# Patient Record
Sex: Female | Born: 1985 | Race: White | Hispanic: No | Marital: Single | Smoking: Never smoker
Health system: Southern US, Community
[De-identification: ages and names within clinical notes are randomized; demographics above are authoritative.]

## PROBLEM LIST (undated history)

## (undated) DIAGNOSIS — D6851 Activated protein C resistance: Secondary | ICD-10-CM

## (undated) DIAGNOSIS — N200 Calculus of kidney: Secondary | ICD-10-CM

## (undated) DIAGNOSIS — I82409 Acute embolism and thrombosis of unspecified deep veins of unspecified lower extremity: Secondary | ICD-10-CM

## (undated) HISTORY — DX: Acute embolism and thrombosis of unspecified deep veins of unspecified lower extremity: I82.409

## (undated) HISTORY — DX: Activated protein C resistance: D68.51

## (undated) HISTORY — DX: Calculus of kidney: N20.0

## (undated) HISTORY — PX: LITHOTRIPSY: SUR834

---
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
## 2006-01-11 ENCOUNTER — Ambulatory Visit: Payer: Self-pay | Admitting: Urology

## 2006-06-20 ENCOUNTER — Ambulatory Visit: Payer: Self-pay | Admitting: Urology

## 2006-11-26 ENCOUNTER — Ambulatory Visit: Payer: Self-pay | Admitting: Urology

## 2007-06-19 ENCOUNTER — Ambulatory Visit: Payer: Self-pay | Admitting: Urology

## 2007-06-27 ENCOUNTER — Inpatient Hospital Stay: Payer: Self-pay | Admitting: Specialist

## 2007-06-27 ENCOUNTER — Ambulatory Visit: Payer: Self-pay | Admitting: Family Medicine

## 2007-07-24 ENCOUNTER — Ambulatory Visit: Payer: Self-pay | Admitting: Internal Medicine

## 2007-08-24 ENCOUNTER — Ambulatory Visit: Payer: Self-pay | Admitting: Internal Medicine

## 2007-11-27 IMAGING — CR DG ABDOMEN 1V
1 series · 1 of 1 positions shown · non-contrast
Comparison: none

REASON FOR EXAM: Nephrolithiasis
COMMENTS:

[view not recorded]
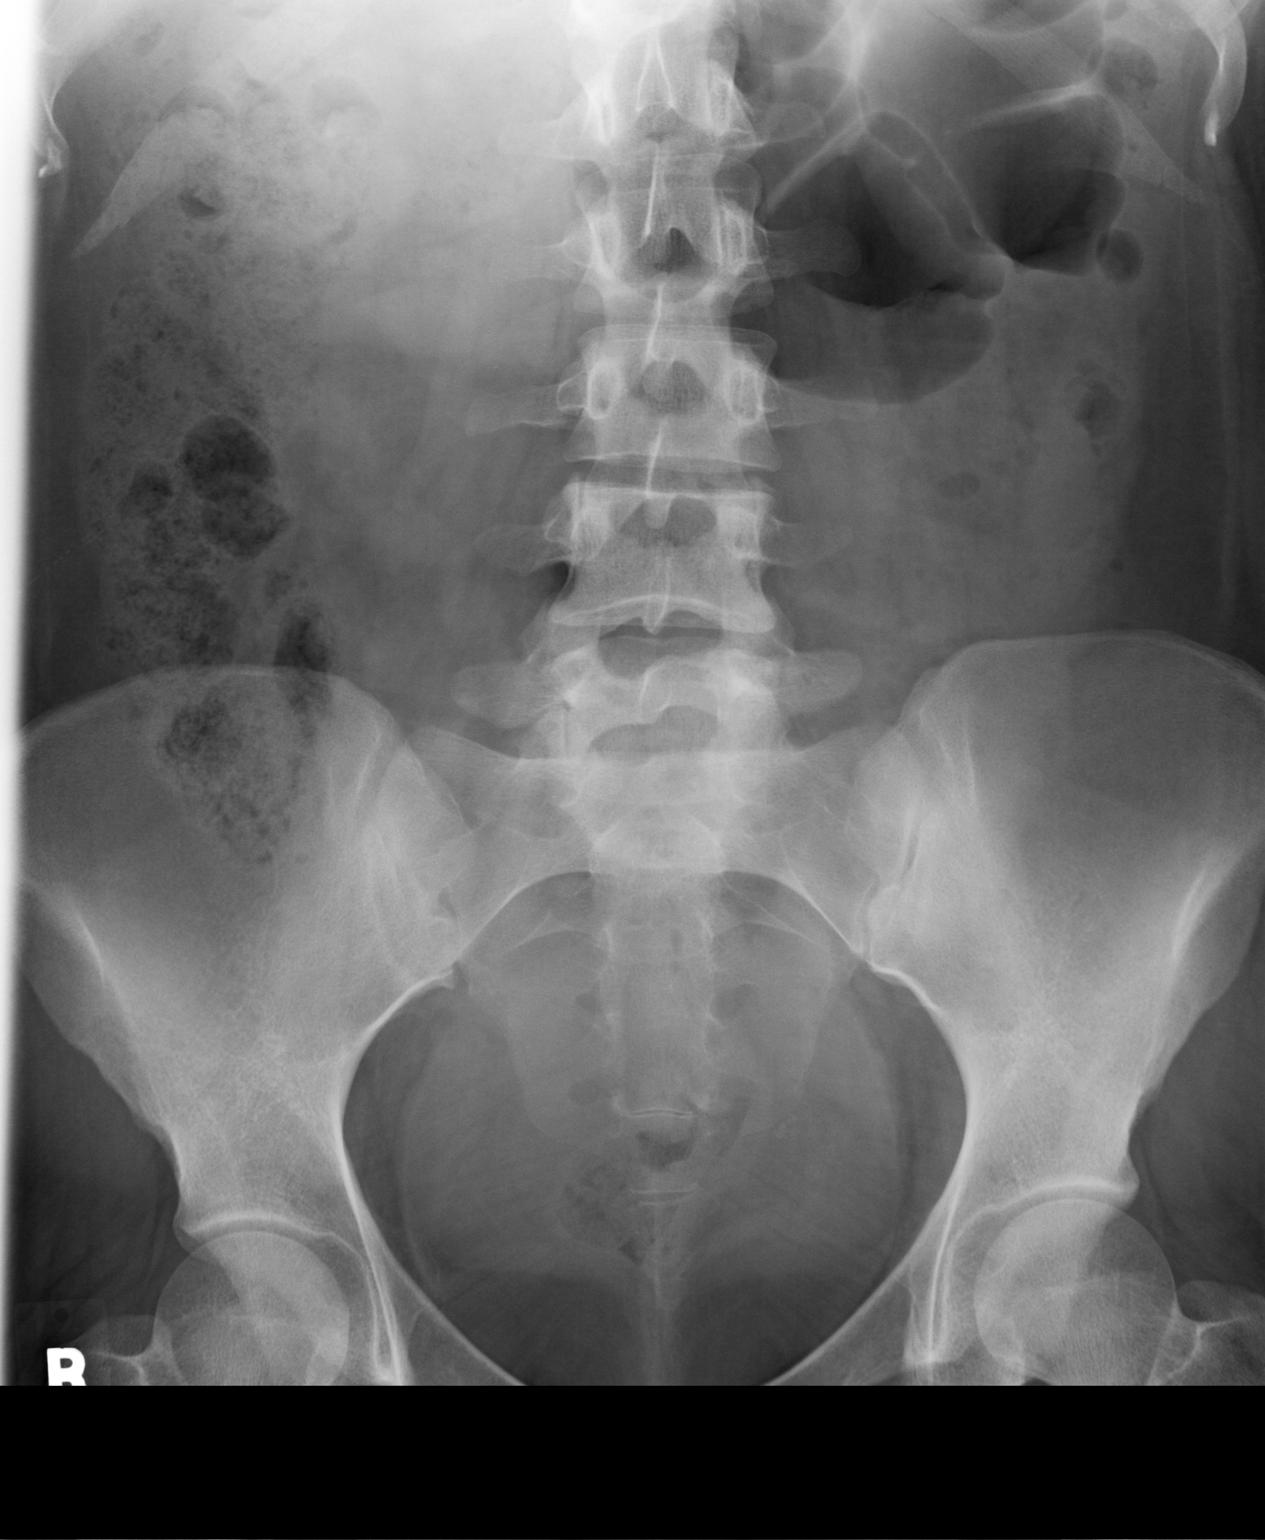

[1 of 1 positions shown; findings below may reference images not displayed]

PROCEDURE:     DXR - DXR KIDNEY URETER BLADDER  - November 26, 2006  [DATE]

RESULT:     Frontal view of lower abdomen and pelvis is performed.

The previously described, faint calcification in the lower pole region of
the RIGHT kidney is not clearly identified on this study. This may be
obscured by a large amount of stool which projects in the region of the
ascending colon. Air is seen within nondilated loops of bowel. No further
calcified densities are identified. The urinary bladder is distended with
urine and the visualized bony skeleton is unremarkable.
IMPRESSION: The previously described calcified density in the region of
the lower pole of the RIGHT renal fossa is not clearly identified.

## 2008-01-13 ENCOUNTER — Ambulatory Visit: Payer: Self-pay | Admitting: Urology

## 2008-08-31 ENCOUNTER — Ambulatory Visit: Payer: Self-pay | Admitting: Urology

## 2009-06-23 ENCOUNTER — Ambulatory Visit: Payer: Self-pay | Admitting: Internal Medicine

## 2009-08-23 ENCOUNTER — Ambulatory Visit: Payer: Self-pay | Admitting: Urology

## 2018-01-23 DIAGNOSIS — A749 Chlamydial infection, unspecified: Secondary | ICD-10-CM

## 2018-01-23 DIAGNOSIS — R87612 Low grade squamous intraepithelial lesion on cytologic smear of cervix (LGSIL): Secondary | ICD-10-CM

## 2018-01-23 HISTORY — DX: Chlamydial infection, unspecified: A74.9

## 2018-01-23 HISTORY — DX: Low grade squamous intraepithelial lesion on cytologic smear of cervix (LGSIL): R87.612

## 2018-07-03 DIAGNOSIS — F341 Dysthymic disorder: Secondary | ICD-10-CM | POA: Diagnosis not present

## 2018-07-10 DIAGNOSIS — F341 Dysthymic disorder: Secondary | ICD-10-CM | POA: Diagnosis not present

## 2018-07-17 DIAGNOSIS — F341 Dysthymic disorder: Secondary | ICD-10-CM | POA: Diagnosis not present

## 2018-07-24 DIAGNOSIS — F341 Dysthymic disorder: Secondary | ICD-10-CM | POA: Diagnosis not present

## 2018-07-31 DIAGNOSIS — F341 Dysthymic disorder: Secondary | ICD-10-CM | POA: Diagnosis not present

## 2018-08-06 DIAGNOSIS — R030 Elevated blood-pressure reading, without diagnosis of hypertension: Secondary | ICD-10-CM | POA: Diagnosis not present

## 2018-08-06 DIAGNOSIS — K219 Gastro-esophageal reflux disease without esophagitis: Secondary | ICD-10-CM | POA: Diagnosis not present

## 2018-08-07 DIAGNOSIS — F341 Dysthymic disorder: Secondary | ICD-10-CM | POA: Diagnosis not present

## 2018-08-11 ENCOUNTER — Other Ambulatory Visit: Payer: Self-pay

## 2018-08-11 ENCOUNTER — Ambulatory Visit
Admission: EM | Admit: 2018-08-11 | Discharge: 2018-08-11 | Disposition: A | Discharge status: Home / Self Care | Payer: BC Managed Care – PPO

## 2018-08-11 DIAGNOSIS — L0501 Pilonidal cyst with abscess: Secondary | ICD-10-CM | POA: Diagnosis not present

## 2018-08-11 MED ORDER — LIDOCAINE 5 % EX OINT: 1.0000 "application " | TOPICAL_OINTMENT | Freq: Every day | CUTANEOUS | 0 refills | Status: DC | PRN

## 2018-08-11 MED ORDER — CLINDAMYCIN HCL 150 MG PO CAPS: 150.0000 mg | ORAL_CAPSULE | Freq: Four times a day (QID) | ORAL | 0 refills | Status: DC

## 2018-08-11 MED ORDER — LIDOCAINE HCL (PF) 1 % IJ SOLN
10.0000 mL | Freq: Once | INTRAMUSCULAR | Status: AC
  Administered 2018-08-11: 10 mL

## 2018-08-11 NOTE — Discharge Instructions (Signed)
It was very nice seeing you today in clinic. Thank you for entrusting me with your care.   Please utilize the medications that we discussed. Your prescriptions have been called in to your pharmacy. Use warm compresses to help promote drainage.  Make arrangements to follow up with your regular doctor in 1 week for re-evaluation if not improving. If your symptoms/condition worsens, please seek follow up care either here or in the ER. Please remember, our [place] providers are "right here with you" when you need [place].   Again, it was my pleasure to take care of you today. Thank you for choosing our clinic. I hope that you start to feel better quickly.   Honor Loh, MSN, APRN, FNP-C, CEN Advanced Practice Provider [facility]

## 2018-08-11 NOTE — ED Triage Notes (Signed)
Patient complains of abscess at top of her buttocks. Patient states that are has been progressively worsening over the last few days. Patient reports that her father has pilonidal cysts and she is concerned this may be what she has as well.

## 2018-08-11 NOTE — ED Provider Notes (Signed)
[place], [place]   Name: Caitlin Cochran DOB: [birth year] MRN: [id number] CSN: [id number] PCP: [facility] date and time:  08/11/18 1457  Chief Complaint:  Abscess   NOTE: Prior to seeing the patient today, I have reviewed the triage nursing documentation and vital signs. Clinical staff has updated patient's PMH/PSHx, current medication list, and drug allergies/intolerances to ensure comprehensive history available to assist in medical decision making.   History:   HPI: Caitlin Cochran is a 33 y.o. female who presents today with complaints of an abscess to her RIGHT buttock. Area has been increasingly painful over the course of the last 2 days. Patient denies any associated fevers. She has not experienced any drainage from the area. Patient intermittently nauseated secondary to the pain. She notes that she has been fatigued over the last few days citing an inability to sleep due to pain as the cause.  Despite her symptoms, patient has not taken any over the counter interventions to help improve/relieve her reported symptoms at home.   Patient has questionable PMH for the same, however she notes that "there are no records of it". Patient has no scarring to the area to suggest previous I&D procedures. Patient advises that her father has a (+) history of recurrent pilonidal cysts.   History reviewed. No pertinent past medical history.  Past Surgical History:  Procedure Laterality Date  . LITHOTRIPSY      Family History  Problem Relation Age of Onset  . Healthy Mother   . Healthy Father     Social History   Tobacco Use  . Smoking status: Never Smoker  . Smokeless tobacco: Never Used  Substance Use Topics  . Alcohol use: Yes    Comment: occasionally  . Drug use: Never    There are no active problems to display for this patient.   Home Medications:    Current Meds  Medication Sig  . cimetidine (TAGAMET) 800 MG tablet Take 800 mg by mouth at  bedtime.    Allergies:   Bee venom and Doxycycline  Review of Systems (ROS): Review of Systems  Constitutional: Positive for fatigue. Negative for chills and fever.  Respiratory: Negative for cough and shortness of breath.   Cardiovascular: Negative for chest pain and palpitations.  Gastrointestinal: Positive for nausea (intermittent). Negative for vomiting.  Skin: Positive for color change.  Neurological: Negative for dizziness, syncope, weakness and headaches.  All other systems reviewed and are negative.    Vital Signs: Today's Vitals   08/11/18 1507 08/11/18 1509 08/11/18 1557  BP:  (!) 118/95   Pulse:  100   Resp:  18   Temp:  98.5 F (36.9 C)   TempSrc:  Oral   SpO2:  100%   Weight: 258 lb (117 kg)    Height: 5\' 7"  (1.702 m)    PainSc: 10-Worst pain ever  10-Worst pain ever    Physical Exam: Physical Exam  Constitutional: She is oriented to person, place, and time and well-developed, well-nourished, and in no distress.  HENT:  Head: Normocephalic and atraumatic.  Mouth/Throat: Mucous membranes are normal.  Eyes: Pupils are equal, round, and reactive to light. EOM are normal.  Cardiovascular: Normal rate, regular rhythm, normal heart sounds and intact distal pulses. Exam reveals no gallop and no friction rub.  No murmur heard. Pulmonary/Chest: Effort normal and breath sounds normal. No respiratory distress. She has no wheezes. She has no rales.  Neurological: She is alert and oriented to person, place,  and time. Gait normal. GCS score is 15.  Skin: Skin is warm and dry.     Psychiatric: Mood, memory, affect and judgment normal.  Nursing note and vitals reviewed.   Urgent Care Treatments / Results:   LABS: PLEASE NOTE: all labs that were ordered this encounter are listed, however only abnormal results are displayed. Labs Reviewed - No data to display  EKG: -None  RADIOLOGY: No results found.  PROCEDURES: Incision and Drainage  Date/Time: 08/11/2018  3:45 PM Performed by: Verlee MonteGray, Jabarri Stefanelli E, NP Authorized by: Verlee MonteGray, Shandricka Monroy E, NP   Consent:    Consent obtained:  Verbal   Consent given by:  Patient   Risks discussed:  Bleeding, incomplete drainage, pain, damage to other organs and infection   Alternatives discussed:  No treatment, alternative treatment and referral Universal protocol:    Patient identity confirmed:  Verbally with patient Location:    Type:  Pilonidal cyst   Location: RIGHT buttock; adjacent to superior aspect of natal cleft. Pre-procedure details:    Skin preparation:  Betadine Anesthesia (see MAR for exact dosages):    Anesthesia method:  Local infiltration   Local anesthetic:  Lidocaine 1% w/o epi Procedure type:    Complexity:  Simple Procedure details:    Needle aspiration: yes     Needle size:  18 G (minimal return)   Incision types:  Single straight   Incision depth:  Dermal   Scalpel blade:  11   Wound management:  Extensive cleaning   Drainage:  Purulent and bloody   Drainage amount:  Scant   Wound treatment:  Wound left open   Packing materials:  None Post-procedure details:    Patient tolerance of procedure:  Tolerated well, no immediate complications Comments:     Discussed minimal return. Cook, DO visualized area following I&D; felt that firmness was likely to be secondary to induration/inflammation.    MEDICATIONS RECEIVED THIS VISIT: Medications  lidocaine (PF) (XYLOCAINE) 1 % injection 10 mL (10 mLs Infiltration Given by Other 08/11/18 1558)    PERTINENT CLINICAL COURSE NOTES/UPDATES:   Initial Impression / Assessment and Plan / Urgent Care Course:  Pertinent labs & imaging results that were available during my care of the patient were personally reviewed by me and considered in my medical decision making (see lab/imaging section of note for values and interpretations).  Caitlin Cochran is a 33 y.o. female [facility] with [facility] of Abscess   Patient  is well appearing overall in clinic today. She does not appear to be in any acute distress. Presenting symptoms (see HPI) and exam as documented above. I&D performed as per above yielding minimal return. Cook, DO visualized area following I&D; discussed. Firmness either related to induration/inflammation, or abscess deeper than can safely be drained in the urgent care setting. DO recommended no further attempts to drain as early abscess with significant inflammation/induration felt to be more likely. Will cover patient with a 7 day course of clindamycin. Recommended warm compresses and/or baths to help reduce inflammation and promote continued drainage. May use Tylenol and/or Ibuprofen as needed for discomfort. Will provide lidocaine ointment in efforts to achieve topical analgesia when patient experiences severe discomfort.  Discussed follow up with primary care physician in 1 week for re-evaluation. If she has not improved with the I&D, oral antibiotics, and conservative treatment, she would benefit from a referral to surgery for further evaluation and management.  I have reviewed the follow up and strict return  precautions for any new or worsening symptoms. Patient is aware of symptoms that would be deemed urgent/emergent, and would thus require further evaluation either here or in the emergency department. At the time of discharge, she verbalized understanding and consent with the discharge plan as it was reviewed with her. All questions were fielded by provider and/or clinic staff prior to patient discharge.    Final Clinical Impressions / Urgent Care Diagnoses:   Final diagnoses:  Pilonidal cyst with abscess    New Prescriptions:  [place] Controlled Substance Registry consulted? Not Applicable  Meds ordered this encounter  Medications  . lidocaine (PF) (XYLOCAINE) 1 % injection 10 mL  . clindamycin (CLEOCIN) 150 MG capsule    Sig: Take 1 capsule (150 mg total) by mouth every 6 (six) hours.     Dispense:  28 capsule    Refill:  0  . lidocaine (XYLOCAINE) 5 % ointment    Sig: Apply 1 application topically daily as needed.    Dispense:  35.44 g    Refill:  0    Recommended Follow up Care:  Patient encouraged to follow up with the following provider within the specified time frame, or sooner as dictated by the severity of her symptoms. As always, she was instructed that for any urgent/emergent care needs, she should seek care either here or in the emergency department for more immediate evaluation.  Follow-up Information    [facility] In 1 week.   Why: General reassessment of symptoms if not improving Contact information: [address] [place] [address] [phone]         NOTE: This note was prepared using Dragon dictation software along with smaller phrase technology. Despite my best ability to proofread, there is the potential that transcriptional errors may still occur from this process, and are completely unintentional.    Verlee MonteGray, Clevester Helzer E, NP 08/11/18 2124

## 2018-08-21 DIAGNOSIS — F341 Dysthymic disorder: Secondary | ICD-10-CM | POA: Diagnosis not present

## 2018-08-28 DIAGNOSIS — F341 Dysthymic disorder: Secondary | ICD-10-CM | POA: Diagnosis not present

## 2018-09-11 DIAGNOSIS — F341 Dysthymic disorder: Secondary | ICD-10-CM | POA: Diagnosis not present

## 2018-09-18 DIAGNOSIS — F341 Dysthymic disorder: Secondary | ICD-10-CM | POA: Diagnosis not present

## 2018-09-25 DIAGNOSIS — F341 Dysthymic disorder: Secondary | ICD-10-CM | POA: Diagnosis not present

## 2018-10-02 DIAGNOSIS — F341 Dysthymic disorder: Secondary | ICD-10-CM | POA: Diagnosis not present

## 2018-10-09 DIAGNOSIS — F341 Dysthymic disorder: Secondary | ICD-10-CM | POA: Diagnosis not present

## 2018-10-16 DIAGNOSIS — F341 Dysthymic disorder: Secondary | ICD-10-CM | POA: Diagnosis not present

## 2018-10-24 DIAGNOSIS — F341 Dysthymic disorder: Secondary | ICD-10-CM | POA: Diagnosis not present

## 2018-10-30 DIAGNOSIS — F341 Dysthymic disorder: Secondary | ICD-10-CM | POA: Diagnosis not present

## 2018-11-06 DIAGNOSIS — F341 Dysthymic disorder: Secondary | ICD-10-CM | POA: Diagnosis not present

## 2018-11-12 DIAGNOSIS — F341 Dysthymic disorder: Secondary | ICD-10-CM | POA: Diagnosis not present

## 2018-11-20 DIAGNOSIS — F341 Dysthymic disorder: Secondary | ICD-10-CM | POA: Diagnosis not present

## 2018-11-27 DIAGNOSIS — F341 Dysthymic disorder: Secondary | ICD-10-CM | POA: Diagnosis not present

## 2018-12-04 DIAGNOSIS — F341 Dysthymic disorder: Secondary | ICD-10-CM | POA: Diagnosis not present

## 2018-12-09 DIAGNOSIS — F341 Dysthymic disorder: Secondary | ICD-10-CM | POA: Diagnosis not present

## 2018-12-17 DIAGNOSIS — F341 Dysthymic disorder: Secondary | ICD-10-CM | POA: Diagnosis not present

## 2018-12-25 DIAGNOSIS — F341 Dysthymic disorder: Secondary | ICD-10-CM | POA: Diagnosis not present

## 2019-01-01 DIAGNOSIS — F341 Dysthymic disorder: Secondary | ICD-10-CM | POA: Diagnosis not present

## 2019-01-08 DIAGNOSIS — F341 Dysthymic disorder: Secondary | ICD-10-CM | POA: Diagnosis not present

## 2019-01-15 DIAGNOSIS — F341 Dysthymic disorder: Secondary | ICD-10-CM | POA: Diagnosis not present

## 2019-01-22 DIAGNOSIS — F341 Dysthymic disorder: Secondary | ICD-10-CM | POA: Diagnosis not present

## 2019-01-29 DIAGNOSIS — F341 Dysthymic disorder: Secondary | ICD-10-CM | POA: Diagnosis not present

## 2019-02-04 DIAGNOSIS — F341 Dysthymic disorder: Secondary | ICD-10-CM | POA: Diagnosis not present

## 2019-02-07 ENCOUNTER — Other Ambulatory Visit: Payer: Self-pay

## 2019-02-07 ENCOUNTER — Ambulatory Visit
Admission: EM | Admit: 2019-02-07 | Discharge: 2019-02-07 | Disposition: A | Discharge status: Home / Self Care | Payer: BC Managed Care – PPO | Attending: Family Medicine | Admitting: Family Medicine

## 2019-02-07 DIAGNOSIS — Z7189 Other specified counseling: Secondary | ICD-10-CM | POA: Insufficient documentation

## 2019-02-07 DIAGNOSIS — Z20822 Contact with and (suspected) exposure to covid-19: Secondary | ICD-10-CM | POA: Insufficient documentation

## 2019-02-07 NOTE — ED Triage Notes (Signed)
Patient states that she was exposed to Covid on the 11th at her second job. States that her primary job will not let her return until she has a negative test.

## 2019-02-07 NOTE — Discharge Instructions (Addendum)
It was very nice seeing you today in clinic. Thank you for entrusting me with your care.   You were tested for SARS-CoV-2 (novel coronavirus) today. Testing is performed by an outside lab ([facility]) and has variable turn around times ranging between 2-5 days. Current recommendations from the the CDC and [place] DHHS require that you remain out of work in order to quarantine at home until negative test results are have been received. In the event that your test results are positive, you will be contacted with further directives. These measures are being implemented out of an abundance of caution to prevent transmission and spread during the current SARS-CoV-2 pandemic.   If you develop any worsening symptoms or concerns, make arrangements to follow up with your regular doctor. If your symptoms are severe, please seek follow up care in the ER. Please remember, our [facility] providers are "right here with you" when you need us.   Again, it was my pleasure to take care of you today. Thank you for choosing our clinic. I hope that you start to feel better quickly.   Axzel Rockhill, MSN, APRN, FNP-C, CEN Advanced Practice Provider [facility]  

## 2019-02-08 LAB — NOVEL CORONAVIRUS, NAA (HOSP ORDER, SEND-OUT TO REF LAB; TAT 18-24 HRS): SARS-CoV-2, NAA: NOT DETECTED

## 2019-02-08 NOTE — ED Provider Notes (Signed)
[place], [place]   Name: Caitlin Cochran DOB: August 03, 1985 MRN: [id number] CSN: [id number] PCP: [facility]  Arrival date and time:  02/07/19 0865  Chief Complaint:  Covid Exposure   NOTE: Prior to seeing the patient today, I have reviewed the triage nursing documentation and vital signs. Clinical staff has updated patient's PMH/PSHx, current medication list, and drug allergies/intolerances to ensure comprehensive history available to assist in medical decision making.   History:   HPI: Caitlin Cochran is a 34 y.o. female who presents today with complaints of recent direct exposure to SARS-CoV-2 (novel coronavirus). Known exposure reported to have occurred on/around 02/03/2019. Patient advises that the person she was exposed to was a co-worker at [employer] at [employer]. Patient presents today with no symptoms; no cough, fevers, or other symptoms commonly associated with SARS-CoV-2. She advises that she feels generally well. Patient presents for testing out of concern for her personal health. She adds that she is is being required to provide documentation of negative test results before she will be allowed to return to work.  History reviewed. No pertinent past medical history.  Past Surgical History:  Procedure Laterality Date  . LITHOTRIPSY      Family History  Problem Relation Age of Onset  . Healthy Mother   . Healthy Father     Social History   Tobacco Use  . Smoking status: Never Smoker  . Smokeless tobacco: Never Used  Substance Use Topics  . Alcohol use: Yes    Comment: occasionally  . Drug use: Never    There are no problems to display for this patient.   Home Medications:    Current Meds  Medication Sig  . cimetidine (TAGAMET) 800 MG tablet Take 800 mg by mouth at bedtime.    Allergies:   Bee venom and Doxycycline  Review of Systems (ROS): Review of Systems  Constitutional: Negative for fatigue and fever.  HENT: Negative for  congestion, ear pain, postnasal drip, rhinorrhea, sinus pressure, sinus pain, sneezing and sore throat.   Eyes: Negative for pain, discharge and redness.  Respiratory: Negative for cough, chest tightness and shortness of breath.   Cardiovascular: Negative for chest pain and palpitations.  Gastrointestinal: Negative for abdominal pain, diarrhea, nausea and vomiting.  Musculoskeletal: Negative for arthralgias, back pain, myalgias and neck pain.  Skin: Negative for color change, pallor and rash.  Neurological: Negative for dizziness, syncope, weakness and headaches.  Hematological: Negative for adenopathy.   Vital Signs: Today's Vitals   02/07/19 0937 02/07/19 0940 02/07/19 1011  BP:  110/63   Pulse:  75   Resp:  18   Temp:  98.7 F (37.1 C)   TempSrc:  Oral   SpO2:  100%   Weight: 238 lb (108 kg)    Height: 5\' 7"  (1.702 m)    PainSc: 0-No pain  0-No pain    Physical Exam: Physical Exam  Constitutional: She is oriented to person, place, and time and well-developed, well-nourished, and in no distress.  HENT:  Head: Normocephalic and atraumatic.  Eyes: Pupils are equal, round, and reactive to light.  Cardiovascular: Normal rate, regular rhythm, normal heart sounds and intact distal pulses.  Pulmonary/Chest: Effort normal and breath sounds normal.  Neurological: She is alert and oriented to person, place, and time. Gait normal.  Skin: Skin is warm and dry. No rash noted. She is not diaphoretic.  Psychiatric: Mood, memory, affect and judgment normal.  Nursing note and vitals reviewed.  Urgent Care Treatments /  Results:   Orders Placed This Encounter  Procedures  . Novel Coronavirus, NAA (Hosp order, Send-out to Ref Lab; TAT 18-24 hrs    LABS: PLEASE NOTE: all labs that were ordered this encounter are listed, however only abnormal results are displayed. Labs Reviewed  NOVEL CORONAVIRUS, NAA (HOSP ORDER, SEND-OUT TO REF LAB; TAT 18-24 HRS)    EKG: -None  RADIOLOGY: No  results found.  PROCEDURES: Procedures  MEDICATIONS RECEIVED THIS VISIT: Medications - No data to display  PERTINENT CLINICAL COURSE NOTES/UPDATES:   Initial Impression / Assessment and Plan / Urgent Care Course:  Pertinent labs & imaging results that were available during my care of the patient were personally reviewed by me and considered in my medical decision making (see lab/imaging section of note for values and interpretations).  Caitlin Cochran is a 34 y.o. female who presents to [facility] today with complaints of Covid Exposure   Patient overall well appearing and in no acute distress today in clinic. Presenting symptoms (see HPI) and exam as documented above. She presents following a direct exposure to SARS-CoV-2 (novel coronavirus). Discussed typical symptom constellation and patient reaffirms that she has none of the commonly associated symptoms seen with SARS-CoV-2 infection. Reviewed potential for infection with recent close contact. Given exposure and potential for infection, testing is reasonable. SARS-CoV-2 swab collected by certified clinical staff. Discussed variable turn around times associated with testing, as swabs are being processed at [facility], and have been between 24-48 hours to come back. She was advised to self quarantine, per [facility] DHHS guidelines, until negative results received.   Current clinical condition warrants patient being out of work in order to quarantine while waiting for testing results. She was provided with the appropriate documentation to provide to her place of employment that will allow for her to RTW on 02/09/2019 with no restrictions. RTW is contingent on her SARS-CoV-2 test results being reviewed as negative.    Discussed follow up with primary care physician should she develop any concerning symptoms. I have reviewed the follow up and strict return precautions for any new or worsening symptoms. Patient is aware of symptoms that would  be deemed urgent/emergent, and would thus require further evaluation either here or in the emergency department. At the time of discharge, he verbalized understanding and consent with the discharge plan as it was reviewed with him. All questions were fielded by provider and/or clinic staff prior to patient discharge.     Final Clinical Impressions / Urgent Care Diagnoses:   Final diagnoses:  Exposure to COVID-19 virus  Encounter for laboratory testing for COVID-19 virus  Advice given about COVID-19 virus infection    New Prescriptions:  [place] Controlled Substance Registry consulted? Not Applicable  No orders of the defined types were placed in this encounter.   Recommended Follow up Care:  Patient encouraged to follow up with the following provider within the specified time frame, or sooner as dictated by the severity of her symptoms. As always, she was instructed that for any urgent/emergent care needs, she should seek care either here or in the emergency department for more immediate evaluation.  Follow-up Information    [place]   Why: As needed Contact information: [place]  [place] [address] [phone]         NOTE: This note was prepared using Dragon dictation software along with smaller phrase technology. Despite my best ability to proofread, there is the potential that transcriptional errors may still occur from  this process, and are completely unintentional.    Verlee Monte, NP 02/08/19 1259

## 2019-02-11 DIAGNOSIS — F341 Dysthymic disorder: Secondary | ICD-10-CM | POA: Diagnosis not present

## 2019-02-18 DIAGNOSIS — F341 Dysthymic disorder: Secondary | ICD-10-CM | POA: Diagnosis not present

## 2019-02-26 DIAGNOSIS — F341 Dysthymic disorder: Secondary | ICD-10-CM | POA: Diagnosis not present

## 2019-03-06 DIAGNOSIS — F341 Dysthymic disorder: Secondary | ICD-10-CM | POA: Diagnosis not present

## 2019-03-13 DIAGNOSIS — F341 Dysthymic disorder: Secondary | ICD-10-CM | POA: Diagnosis not present

## 2019-03-19 DIAGNOSIS — F341 Dysthymic disorder: Secondary | ICD-10-CM | POA: Diagnosis not present

## 2019-03-26 DIAGNOSIS — F341 Dysthymic disorder: Secondary | ICD-10-CM | POA: Diagnosis not present

## 2019-03-28 DIAGNOSIS — K219 Gastro-esophageal reflux disease without esophagitis: Secondary | ICD-10-CM | POA: Diagnosis not present

## 2019-03-28 DIAGNOSIS — Z1389 Encounter for screening for other disorder: Secondary | ICD-10-CM | POA: Diagnosis not present

## 2019-04-02 DIAGNOSIS — F341 Dysthymic disorder: Secondary | ICD-10-CM | POA: Diagnosis not present

## 2019-04-09 DIAGNOSIS — F341 Dysthymic disorder: Secondary | ICD-10-CM | POA: Diagnosis not present

## 2019-04-17 DIAGNOSIS — F341 Dysthymic disorder: Secondary | ICD-10-CM | POA: Diagnosis not present

## 2019-04-21 DIAGNOSIS — F341 Dysthymic disorder: Secondary | ICD-10-CM | POA: Diagnosis not present

## 2019-05-01 DIAGNOSIS — F341 Dysthymic disorder: Secondary | ICD-10-CM | POA: Diagnosis not present

## 2019-05-06 DIAGNOSIS — F341 Dysthymic disorder: Secondary | ICD-10-CM | POA: Diagnosis not present

## 2019-05-13 DIAGNOSIS — F341 Dysthymic disorder: Secondary | ICD-10-CM | POA: Diagnosis not present

## 2019-05-20 DIAGNOSIS — F341 Dysthymic disorder: Secondary | ICD-10-CM | POA: Diagnosis not present

## 2019-05-29 DIAGNOSIS — F341 Dysthymic disorder: Secondary | ICD-10-CM | POA: Diagnosis not present

## 2019-06-04 DIAGNOSIS — F341 Dysthymic disorder: Secondary | ICD-10-CM | POA: Diagnosis not present

## 2019-06-12 DIAGNOSIS — F341 Dysthymic disorder: Secondary | ICD-10-CM | POA: Diagnosis not present

## 2019-06-19 DIAGNOSIS — F341 Dysthymic disorder: Secondary | ICD-10-CM | POA: Diagnosis not present

## 2019-06-25 DIAGNOSIS — F341 Dysthymic disorder: Secondary | ICD-10-CM | POA: Diagnosis not present

## 2019-07-02 DIAGNOSIS — F341 Dysthymic disorder: Secondary | ICD-10-CM | POA: Diagnosis not present

## 2019-07-09 DIAGNOSIS — F341 Dysthymic disorder: Secondary | ICD-10-CM | POA: Diagnosis not present

## 2019-07-16 DIAGNOSIS — F341 Dysthymic disorder: Secondary | ICD-10-CM | POA: Diagnosis not present

## 2019-07-23 DIAGNOSIS — F341 Dysthymic disorder: Secondary | ICD-10-CM | POA: Diagnosis not present

## 2019-07-30 DIAGNOSIS — F341 Dysthymic disorder: Secondary | ICD-10-CM | POA: Diagnosis not present

## 2019-08-06 DIAGNOSIS — F341 Dysthymic disorder: Secondary | ICD-10-CM | POA: Diagnosis not present

## 2019-08-13 DIAGNOSIS — F341 Dysthymic disorder: Secondary | ICD-10-CM | POA: Diagnosis not present

## 2019-08-21 DIAGNOSIS — F341 Dysthymic disorder: Secondary | ICD-10-CM | POA: Diagnosis not present

## 2019-08-27 DIAGNOSIS — F341 Dysthymic disorder: Secondary | ICD-10-CM | POA: Diagnosis not present

## 2019-09-01 DIAGNOSIS — F341 Dysthymic disorder: Secondary | ICD-10-CM | POA: Diagnosis not present

## 2019-09-09 DIAGNOSIS — F341 Dysthymic disorder: Secondary | ICD-10-CM | POA: Diagnosis not present

## 2019-09-16 DIAGNOSIS — F341 Dysthymic disorder: Secondary | ICD-10-CM | POA: Diagnosis not present

## 2019-09-24 DIAGNOSIS — F341 Dysthymic disorder: Secondary | ICD-10-CM | POA: Diagnosis not present

## 2019-10-01 DIAGNOSIS — F341 Dysthymic disorder: Secondary | ICD-10-CM | POA: Diagnosis not present

## 2019-10-07 DIAGNOSIS — F341 Dysthymic disorder: Secondary | ICD-10-CM | POA: Diagnosis not present

## 2019-10-15 DIAGNOSIS — F341 Dysthymic disorder: Secondary | ICD-10-CM | POA: Diagnosis not present

## 2019-10-23 DIAGNOSIS — F341 Dysthymic disorder: Secondary | ICD-10-CM | POA: Diagnosis not present

## 2019-10-30 DIAGNOSIS — F341 Dysthymic disorder: Secondary | ICD-10-CM | POA: Diagnosis not present

## 2019-11-06 DIAGNOSIS — F341 Dysthymic disorder: Secondary | ICD-10-CM | POA: Diagnosis not present

## 2019-11-12 DIAGNOSIS — F341 Dysthymic disorder: Secondary | ICD-10-CM | POA: Diagnosis not present

## 2020-02-16 ENCOUNTER — Other Ambulatory Visit: Payer: BC Managed Care – PPO

## 2020-02-18 ENCOUNTER — Other Ambulatory Visit: Payer: BC Managed Care – PPO

## 2020-02-18 DIAGNOSIS — Z20822 Contact with and (suspected) exposure to covid-19: Secondary | ICD-10-CM | POA: Diagnosis not present

## 2020-02-20 LAB — NOVEL CORONAVIRUS, NAA: SARS-CoV-2, NAA: NOT DETECTED

## 2020-02-20 LAB — SARS-COV-2, NAA 2 DAY TAT

## 2020-03-24 DIAGNOSIS — F341 Dysthymic disorder: Secondary | ICD-10-CM | POA: Diagnosis not present

## 2020-03-31 DIAGNOSIS — F341 Dysthymic disorder: Secondary | ICD-10-CM | POA: Diagnosis not present

## 2020-04-07 DIAGNOSIS — F341 Dysthymic disorder: Secondary | ICD-10-CM | POA: Diagnosis not present

## 2020-04-13 DIAGNOSIS — F341 Dysthymic disorder: Secondary | ICD-10-CM | POA: Diagnosis not present

## 2020-04-15 ENCOUNTER — Encounter: Payer: Self-pay | Admitting: Obstetrics and Gynecology

## 2020-04-21 ENCOUNTER — Other Ambulatory Visit (HOSPITAL_COMMUNITY)
Admission: RE | Admit: 2020-04-21 | Discharge: 2020-04-21 | Disposition: A | Discharge status: Home / Self Care | Payer: BC Managed Care – PPO | Source: Ambulatory Visit | Attending: Obstetrics and Gynecology | Admitting: Obstetrics and Gynecology

## 2020-04-21 ENCOUNTER — Encounter: Payer: Self-pay | Admitting: Obstetrics and Gynecology

## 2020-04-21 ENCOUNTER — Other Ambulatory Visit: Payer: Self-pay

## 2020-04-21 ENCOUNTER — Ambulatory Visit (INDEPENDENT_AMBULATORY_CARE_PROVIDER_SITE_OTHER): Payer: BC Managed Care – PPO | Admitting: Obstetrics and Gynecology

## 2020-04-21 VITALS — BP 130/80 | Ht 67.0 in | Wt 255.0 lb

## 2020-04-21 DIAGNOSIS — R87612 Low grade squamous intraepithelial lesion on cytologic smear of cervix (LGSIL): Secondary | ICD-10-CM | POA: Diagnosis not present

## 2020-04-21 DIAGNOSIS — Z01419 Encounter for gynecological examination (general) (routine) without abnormal findings: Secondary | ICD-10-CM

## 2020-04-21 DIAGNOSIS — Z1151 Encounter for screening for human papillomavirus (HPV): Secondary | ICD-10-CM | POA: Diagnosis not present

## 2020-04-21 DIAGNOSIS — F341 Dysthymic disorder: Secondary | ICD-10-CM | POA: Diagnosis not present

## 2020-04-21 DIAGNOSIS — Z124 Encounter for screening for malignant neoplasm of cervix: Secondary | ICD-10-CM

## 2020-04-21 DIAGNOSIS — Z30011 Encounter for initial prescription of contraceptive pills: Secondary | ICD-10-CM

## 2020-04-21 MED ORDER — NORETHINDRONE 0.35 MG PO TABS: 1.0000 | ORAL_TABLET | Freq: Every day | ORAL | 3 refills | Status: DC

## 2020-04-21 NOTE — Patient Instructions (Signed)
I value your feedback and you entrusting us with your care. If you get a [place] patient survey, I would appreciate you taking the time to let us know about your experience today. Thank you! ? ? ?

## 2020-04-21 NOTE — Progress Notes (Signed)
PCP:  [facility]   Chief Complaint  Patient presents with  . Gynecologic Exam    No concerns     HPI:      Ms. Caitlin Cochran is a 35 y.o. No obstetric history on file. whose LMP was Patient's last menstrual period was 04/08/2020 (exact date)., presents today for her NP annual examination.  Her menses are regular every 1-2 months, lasting 5 days, light to mod flow.  Dysmenorrhea mild, occurring first 1-2 days of flow. She does not have intermenstrual bleeding. LMP was closer to Q80 days but was losing wt over this time.  Sex activity: single partner, contraception - none. Hx of Factor V Leiden. Would like BC. 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 There is no FH of ovarian cancer. The patient does not do self-breast exams.  Tobacco use: The patient denies current or previous tobacco use. Alcohol use: social drinker No drug use.  Exercise: moderately active  She does get adequate calcium but not Vitamin D in her diet.   Past Medical History:  Diagnosis Date  . Chlamydia 01/2018  . DVT (deep venous thrombosis) (HCC)    on OCPs, diagnosed with factor V Leiden  . Factor V Leiden (HCC)   . Kidney stone   . 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 LITHOTRIPSY      Family History  Problem Relation Age of Onset  . Healthy Mother   . Healthy Father   . Cancer Maternal Grandmother        ? type  . Heart attack Maternal Grandfather   . Cancer Paternal Grandfather        ? type  . Parkinson's disease Paternal Grandfather     Social History   Socioeconomic History  . Marital status: Single    Spouse name: Not on file  . Number of children: Not on file  . Years of  education: Not on file  . Highest education level: Not on file  Occupational History  . Not on file  Tobacco Use  . Smoking status: Never Smoker  . Smokeless tobacco: Never Used  Vaping Use  . Vaping Use: Never used  Substance and Sexual Activity  . Alcohol use: Yes    Comment: occasionally  . Drug use: Never  . Sexual activity: Yes    Birth control/protection: None  Other Topics Concern  . Not on file  Social History Narrative  . Not on file   Social Determinants of Health   Financial Resource Strain: Not on file  Food Insecurity: Not on file  Transportation Needs: Not on file  Physical Activity: Not on file  Stress: Not on file  Social Connections: Not on file  Intimate Partner Violence: Not on file     Current Outpatient Medications:  .  Aspirin-Acetaminophen-Caffeine (EXCEDRIN EXTRA STRENGTH PO), Excedrin Extra Strength  PRN headache, Disp: , Rfl:  .  norethindrone (MICRONOR) 0.35 MG tablet, Take 1 tablet (0.35 mg total) by mouth daily., Disp: 84 tablet, Rfl: 3     ROS:  Review of Systems  Constitutional: Negative for fatigue, fever and unexpected weight change.  Respiratory: Negative for cough, shortness of breath and wheezing.  Cardiovascular: Negative for chest pain, palpitations and leg swelling.  Gastrointestinal: Negative for blood in stool, constipation, diarrhea, nausea and vomiting.  Endocrine: Negative for cold intolerance, heat intolerance and polyuria.  Genitourinary: Negative for dyspareunia, dysuria, flank pain, frequency, genital sores, hematuria, menstrual problem, pelvic pain, urgency, vaginal bleeding, vaginal discharge and vaginal pain.  Musculoskeletal: Negative for back pain, joint swelling and myalgias.  Skin: Negative for rash.  Neurological: Negative for dizziness, syncope, light-headedness, numbness and headaches.  Hematological: Negative for adenopathy.  Psychiatric/Behavioral: Positive for agitation. Negative for confusion, sleep  disturbance and suicidal ideas. The patient is not nervous/anxious.   BREAST: No symptoms   Objective: BP 130/80   Ht 5\' 7"  (1.702 m)   Wt 255 lb (115.7 kg)   LMP 04/08/2020 (Exact Date)   BMI 39.94 kg/m    Physical Exam Constitutional:      Appearance: She is well-developed.  Genitourinary:     Vulva normal.     Right Labia: No rash, tenderness or lesions.    Left Labia: No tenderness, lesions or rash.    No vaginal discharge, erythema or tenderness.      Right Adnexa: not tender and no mass present.    Left Adnexa: not tender and no mass present.    No cervical friability or polyp.     Uterus is not enlarged or tender.  Breasts:     Right: No mass, nipple discharge, skin change or tenderness.     Left: No mass, nipple discharge, skin change or tenderness.    Neck:     Thyroid: No thyromegaly.  Cardiovascular:     Rate and Rhythm: Normal rate and regular rhythm.     Heart sounds: Normal heart sounds. No murmur heard.   Pulmonary:     Effort: Pulmonary effort is normal.     Breath sounds: Normal breath sounds.  Abdominal:     Palpations: Abdomen is soft.     Tenderness: There is no abdominal tenderness. There is no guarding or rebound.  Musculoskeletal:        General: Normal range of motion.     Cervical back: Normal range of motion.  Lymphadenopathy:     Cervical: No cervical adenopathy.  Neurological:     General: No focal deficit present.     Mental Status: She is alert and oriented to person, place, and time.     Cranial Nerves: No cranial nerve deficit.  Skin:    General: Skin is warm and dry.  Psychiatric:        Mood and Affect: Mood normal.        Behavior: Behavior normal.        Thought Content: Thought content normal.        Judgment: Judgment normal.  Vitals reviewed.     Assessment/Plan: Encounter for annual routine gynecological examination  Cervical cancer screening - Plan: Cytology - PAP  Screening for HPV (human papillomavirus) -  Plan: Cytology - PAP  LGSIL on Pap smear of cervix - Plan: Cytology - PAP; repeat pap today. Will f/u if abn.   Encounter for initial prescription of contraceptive pills - Plan: norethindrone (MICRONOR) 0.35 MG tablet; POP start with next menses, condoms. Rx eRxd. Can't have estrogen. Also discussed IUD. F/u prn.   Meds ordered this encounter  Medications  . norethindrone (MICRONOR) 0.35 MG tablet    Sig: Take 1 tablet (0.35 mg total) by mouth daily.    Dispense:  84 tablet    Refill:  3    Order Specific Question:   Supervising Provider    Answer:   Nadara Mustard [[id number]]             GYN counsel family planning choices, adequate intake of calcium and vitamin D, diet and exercise     F/U  Return in about 1 year (around 04/21/2021).  Caitlin Perona B. Bron Snellings, PA-C 04/21/2020 3:36 PM

## 2020-04-23 LAB — CYTOLOGY - PAP
Comment: NEGATIVE
Diagnosis: NEGATIVE
High risk HPV: NEGATIVE

## 2020-04-28 DIAGNOSIS — F341 Dysthymic disorder: Secondary | ICD-10-CM | POA: Diagnosis not present

## 2020-05-05 DIAGNOSIS — F341 Dysthymic disorder: Secondary | ICD-10-CM | POA: Diagnosis not present

## 2021-02-02 ENCOUNTER — Encounter: Payer: Self-pay | Admitting: Obstetrics and Gynecology

## 2021-02-25 ENCOUNTER — Observation Stay
Admission: EM | Admit: 2021-02-25 | Discharge: 2021-02-27 | Disposition: A | Discharge status: Home / Self Care | Payer: BC Managed Care – PPO | Attending: General Surgery | Admitting: General Surgery

## 2021-02-25 ENCOUNTER — Emergency Department: Payer: BC Managed Care – PPO

## 2021-02-25 ENCOUNTER — Encounter: Payer: Self-pay | Admitting: General Surgery

## 2021-02-25 ENCOUNTER — Other Ambulatory Visit: Payer: Self-pay

## 2021-02-25 ENCOUNTER — Observation Stay: Payer: BC Managed Care – PPO | Admitting: Certified Registered"

## 2021-02-25 ENCOUNTER — Encounter
Admission: EM | Disposition: A | Discharge status: Home / Self Care | Payer: Self-pay | Source: Home / Self Care | Attending: Emergency Medicine

## 2021-02-25 DIAGNOSIS — Z20822 Contact with and (suspected) exposure to covid-19: Secondary | ICD-10-CM | POA: Insufficient documentation

## 2021-02-25 DIAGNOSIS — R079 Chest pain, unspecified: Secondary | ICD-10-CM | POA: Diagnosis not present

## 2021-02-25 DIAGNOSIS — K8012 Calculus of gallbladder with acute and chronic cholecystitis without obstruction: Principal | ICD-10-CM | POA: Insufficient documentation

## 2021-02-25 DIAGNOSIS — R1013 Epigastric pain: Secondary | ICD-10-CM | POA: Diagnosis not present

## 2021-02-25 DIAGNOSIS — K812 Acute cholecystitis with chronic cholecystitis: Secondary | ICD-10-CM | POA: Diagnosis not present

## 2021-02-25 DIAGNOSIS — K810 Acute cholecystitis: Secondary | ICD-10-CM | POA: Diagnosis present

## 2021-02-25 DIAGNOSIS — Z7982 Long term (current) use of aspirin: Secondary | ICD-10-CM | POA: Diagnosis not present

## 2021-02-25 DIAGNOSIS — K8020 Calculus of gallbladder without cholecystitis without obstruction: Secondary | ICD-10-CM | POA: Diagnosis not present

## 2021-02-25 LAB — BASIC METABOLIC PANEL
Anion gap: 8 (ref 5–15)
BUN: 15 mg/dL (ref 6–20)
CO2: 22 mmol/L (ref 22–32)
Calcium: 8.7 mg/dL — ABNORMAL LOW (ref 8.9–10.3)
Chloride: 104 mmol/L (ref 98–111)
Creatinine, Ser: 0.75 mg/dL (ref 0.44–1.00)
GFR, Estimated: >60 mL/min (ref >60)
Glucose, Bld: 113 mg/dL — ABNORMAL HIGH (ref 70–99)
Potassium: 3.6 mmol/L (ref 3.5–5.1)
Sodium: 134 mmol/L — ABNORMAL LOW (ref 135–145)

## 2021-02-25 LAB — CBC
HCT: 42.6 % (ref 36.0–46.0)
Hemoglobin: 14.1 g/dL (ref 12.0–15.0)
MCH: 29.9 pg (ref 26.0–34.0)
MCHC: 33.1 g/dL (ref 30.0–36.0)
MCV: 90.4 fL (ref 80.0–100.0)
Platelets: 342 10*3/uL (ref 150–400)
RBC: 4.71 MIL/uL (ref 3.87–5.11)
RDW: 12.3 % (ref 11.5–15.5)
WBC: 8.1 10*3/uL (ref 4.0–10.5)
nRBC: 0 % (ref 0.0–0.2)

## 2021-02-25 LAB — URINALYSIS, COMPLETE (UACMP) WITH MICROSCOPIC
Bacteria, UA: NONE SEEN
Glucose, UA: NEGATIVE mg/dL
Nitrite: NEGATIVE
Protein, ur: 100 mg/dL — AB
Specific Gravity, Urine: >1.03 — ABNORMAL HIGH (ref 1.005–1.030)
pH: 5.5 (ref 5.0–8.0)

## 2021-02-25 LAB — HEPATIC FUNCTION PANEL
ALT: 25 U/L (ref 0–44)
AST: 25 U/L (ref 15–41)
Albumin: 4.2 g/dL (ref 3.5–5.0)
Alkaline Phosphatase: 54 U/L (ref 38–126)
Bilirubin, Direct: <0.1 mg/dL (ref 0.0–0.2)
Total Bilirubin: 0.5 mg/dL (ref 0.3–1.2)
Total Protein: 8 g/dL (ref 6.5–8.1)

## 2021-02-25 LAB — RESP PANEL BY RT-PCR (FLU A&B, COVID) ARPGX2
Influenza A by PCR: NEGATIVE
Influenza B by PCR: NEGATIVE
SARS Coronavirus 2 by RT PCR: NEGATIVE

## 2021-02-25 LAB — HIV ANTIBODY (ROUTINE TESTING W REFLEX): HIV Screen 4th Generation wRfx: NONREACTIVE

## 2021-02-25 LAB — POC URINE PREG, ED: Preg Test, Ur: NEGATIVE

## 2021-02-25 LAB — TROPONIN I (HIGH SENSITIVITY)
Troponin I (High Sensitivity): 3 ng/L (ref <18)
Troponin I (High Sensitivity): 3 ng/L (ref <18)

## 2021-02-25 LAB — LIPASE, BLOOD: Lipase: 28 U/L (ref 11–51)

## 2021-02-25 SURGERY — CHOLECYSTECTOMY, ROBOT-ASSISTED, LAPAROSCOPIC
Anesthesia: General

## 2021-02-25 MED ORDER — LACTATED RINGERS IV SOLN: INTRAVENOUS | Status: DC

## 2021-02-25 MED ORDER — FENTANYL CITRATE (PF) 100 MCG/2ML IJ SOLN
INTRAMUSCULAR | Status: AC
  Filled 2021-02-25: qty 2

## 2021-02-25 MED ORDER — PROPOFOL 10 MG/ML IV BOLUS
INTRAVENOUS | Status: DC | PRN
  Administered 2021-02-25: 200 mg via INTRAVENOUS

## 2021-02-25 MED ORDER — LACTATED RINGERS IV SOLN: INTRAVENOUS | Status: DC | PRN

## 2021-02-25 MED ORDER — ROCURONIUM BROMIDE 100 MG/10ML IV SOLN
INTRAVENOUS | Status: DC | PRN
  Administered 2021-02-25: 50 mg via INTRAVENOUS
  Administered 2021-02-25: 30 mg via INTRAVENOUS

## 2021-02-25 MED ORDER — PIPERACILLIN-TAZOBACTAM 3.375 G IVPB
3.3750 g | Freq: Three times a day (TID) | INTRAVENOUS | Status: DC
  Administered 2021-02-25 – 2021-02-27 (×5): 3.375 g via INTRAVENOUS
  Filled 2021-02-25 (×5): qty 50

## 2021-02-25 MED ORDER — INDOCYANINE GREEN 25 MG IV SOLR
25.0000 mg | Freq: Once | INTRAVENOUS | Status: DC
  Filled 2021-02-25: qty 10

## 2021-02-25 MED ORDER — DEXAMETHASONE SODIUM PHOSPHATE 10 MG/ML IJ SOLN
INTRAMUSCULAR | Status: DC | PRN
  Administered 2021-02-25: 10 mg via INTRAVENOUS

## 2021-02-25 MED ORDER — LACTATED RINGERS IV BOLUS
1000.0000 mL | Freq: Once | INTRAVENOUS | Status: AC
  Administered 2021-02-25: 1000 mL via INTRAVENOUS

## 2021-02-25 MED ORDER — 0.9 % SODIUM CHLORIDE (POUR BTL) OPTIME
TOPICAL | Status: DC | PRN
  Administered 2021-02-25: 500 mL

## 2021-02-25 MED ORDER — PROMETHAZINE HCL 25 MG/ML IJ SOLN: 6.2500 mg | INTRAMUSCULAR | Status: DC | PRN

## 2021-02-25 MED ORDER — ACETAMINOPHEN 325 MG PO TABS
650.0000 mg | ORAL_TABLET | Freq: Four times a day (QID) | ORAL | Status: DC | PRN
  Administered 2021-02-25 – 2021-02-26 (×2): 650 mg via ORAL
  Filled 2021-02-25: qty 2

## 2021-02-25 MED ORDER — ONDANSETRON HCL 4 MG/2ML IJ SOLN
INTRAMUSCULAR | Status: AC
  Administered 2021-02-25: 4 mg via INTRAVENOUS
  Filled 2021-02-25: qty 2

## 2021-02-25 MED ORDER — SUGAMMADEX SODIUM 500 MG/5ML IV SOLN
INTRAVENOUS | Status: DC | PRN
Start: 2021-02-25 — End: 2021-02-25
  Administered 2021-02-25: 250 mg via INTRAVENOUS

## 2021-02-25 MED ORDER — FENTANYL CITRATE (PF) 100 MCG/2ML IJ SOLN
25.0000 ug | INTRAMUSCULAR | Status: DC | PRN
  Administered 2021-02-25 (×2): 25 ug via INTRAVENOUS

## 2021-02-25 MED ORDER — BUPIVACAINE-EPINEPHRINE (PF) 0.25% -1:200000 IJ SOLN
INTRAMUSCULAR | Status: AC
  Filled 2021-02-25: qty 30

## 2021-02-25 MED ORDER — CEFAZOLIN SODIUM-DEXTROSE 1-4 GM/50ML-% IV SOLN
INTRAVENOUS | Status: DC | PRN
  Administered 2021-02-25: 3 g via INTRAVENOUS

## 2021-02-25 MED ORDER — ONDANSETRON HCL 4 MG/2ML IJ SOLN
4.0000 mg | Freq: Once | INTRAMUSCULAR | Status: AC
  Administered 2021-02-25: 4 mg via INTRAVENOUS
  Filled 2021-02-25: qty 2

## 2021-02-25 MED ORDER — PIPERACILLIN-TAZOBACTAM 3.375 G IVPB 30 MIN
3.3750 g | Freq: Once | INTRAVENOUS | Status: AC
  Administered 2021-02-25: 3.375 g via INTRAVENOUS
  Filled 2021-02-25: qty 50

## 2021-02-25 MED ORDER — ROCURONIUM BROMIDE 10 MG/ML (PF) SYRINGE
PREFILLED_SYRINGE | INTRAVENOUS | Status: AC
  Filled 2021-02-25: qty 10

## 2021-02-25 MED ORDER — ACETAMINOPHEN 325 MG PO TABS
ORAL_TABLET | ORAL | Status: AC
  Filled 2021-02-25: qty 2

## 2021-02-25 MED ORDER — LIDOCAINE HCL (CARDIAC) PF 100 MG/5ML IV SOSY
PREFILLED_SYRINGE | INTRAVENOUS | Status: DC | PRN
Start: 2021-02-25 — End: 2021-02-25
  Administered 2021-02-25: 100 mg via INTRAVENOUS

## 2021-02-25 MED ORDER — BUPIVACAINE-EPINEPHRINE 0.25% -1:200000 IJ SOLN
INTRAMUSCULAR | Status: DC | PRN
  Administered 2021-02-25: 20 mL

## 2021-02-25 MED ORDER — HYDROCODONE-ACETAMINOPHEN 5-325 MG PO TABS
1.0000 | ORAL_TABLET | ORAL | Status: DC | PRN
  Administered 2021-02-25 (×2): 1 via ORAL
  Administered 2021-02-26 (×2): 2 via ORAL
  Administered 2021-02-26: 1 via ORAL
  Filled 2021-02-25: qty 1
  Filled 2021-02-25: qty 2
  Filled 2021-02-25 (×2): qty 1
  Filled 2021-02-25 (×2): qty 2

## 2021-02-25 MED ORDER — MIDAZOLAM HCL 2 MG/2ML IJ SOLN
INTRAMUSCULAR | Status: DC | PRN
  Administered 2021-02-25: 2 mg via INTRAVENOUS

## 2021-02-25 MED ORDER — ACETAMINOPHEN 650 MG RE SUPP: 650.0000 mg | Freq: Four times a day (QID) | RECTAL | Status: DC | PRN

## 2021-02-25 MED ORDER — INDOCYANINE GREEN 25 MG IV SOLR
1.2500 mg | Freq: Once | INTRAVENOUS | Status: AC
  Administered 2021-02-25: 1.25 mg via INTRAVENOUS
  Filled 2021-02-25: qty 0.5

## 2021-02-25 MED ORDER — DEXMEDETOMIDINE (PRECEDEX) IN NS 20 MCG/5ML (4 MCG/ML) IV SYRINGE
PREFILLED_SYRINGE | INTRAVENOUS | Status: DC | PRN
  Administered 2021-02-25: 12 ug via INTRAVENOUS
  Administered 2021-02-25: 8 ug via INTRAVENOUS

## 2021-02-25 MED ORDER — SUCCINYLCHOLINE CHLORIDE 200 MG/10ML IV SOSY
PREFILLED_SYRINGE | INTRAVENOUS | Status: DC | PRN
Start: 2021-02-25 — End: 2021-02-25
  Administered 2021-02-25: 120 mg via INTRAVENOUS

## 2021-02-25 MED ORDER — ONDANSETRON 4 MG PO TBDP: 4.0000 mg | ORAL_TABLET | Freq: Four times a day (QID) | ORAL | Status: DC | PRN

## 2021-02-25 MED ORDER — ONDANSETRON HCL 4 MG/2ML IJ SOLN
INTRAMUSCULAR | Status: AC
  Filled 2021-02-25: qty 2

## 2021-02-25 MED ORDER — FENTANYL CITRATE (PF) 100 MCG/2ML IJ SOLN
INTRAMUSCULAR | Status: DC | PRN
  Administered 2021-02-25 (×4): 50 ug via INTRAVENOUS

## 2021-02-25 MED ORDER — ONDANSETRON HCL 4 MG/2ML IJ SOLN
4.0000 mg | Freq: Four times a day (QID) | INTRAMUSCULAR | Status: DC | PRN
  Administered 2021-02-26: 4 mg via INTRAVENOUS
  Filled 2021-02-25: qty 2

## 2021-02-25 MED ORDER — PANTOPRAZOLE SODIUM 40 MG IV SOLR
40.0000 mg | Freq: Every day | INTRAVENOUS | Status: DC
  Administered 2021-02-25 – 2021-02-26 (×2): 40 mg via INTRAVENOUS
  Filled 2021-02-25 (×2): qty 40

## 2021-02-25 MED ORDER — MORPHINE SULFATE (PF) 4 MG/ML IV SOLN: 4.0000 mg | INTRAVENOUS | Status: DC | PRN

## 2021-02-25 MED ORDER — ENOXAPARIN SODIUM 40 MG/0.4ML IJ SOSY
40.0000 mg | PREFILLED_SYRINGE | INTRAMUSCULAR | Status: DC
  Administered 2021-02-25 – 2021-02-27 (×3): 40 mg via SUBCUTANEOUS
  Filled 2021-02-25 (×3): qty 0.4

## 2021-02-25 MED ORDER — DEXAMETHASONE SODIUM PHOSPHATE 10 MG/ML IJ SOLN
INTRAMUSCULAR | Status: AC
  Filled 2021-02-25: qty 1

## 2021-02-25 MED ORDER — SODIUM CHLORIDE 0.9 % IV SOLN: INTRAVENOUS | Status: DC

## 2021-02-25 MED ORDER — FENTANYL CITRATE PF 50 MCG/ML IJ SOSY
50.0000 ug | PREFILLED_SYRINGE | Freq: Once | INTRAMUSCULAR | Status: AC
  Administered 2021-02-25: 50 ug via INTRAVENOUS
  Filled 2021-02-25: qty 1

## 2021-02-25 MED ORDER — FENTANYL CITRATE (PF) 100 MCG/2ML IJ SOLN
INTRAMUSCULAR | Status: AC
  Administered 2021-02-25: 50 ug via INTRAVENOUS
  Filled 2021-02-25: qty 2

## 2021-02-25 MED ORDER — ONDANSETRON HCL 4 MG/2ML IJ SOLN
INTRAMUSCULAR | Status: DC | PRN
  Administered 2021-02-25: 4 mg via INTRAVENOUS

## 2021-02-25 MED ORDER — MIDAZOLAM HCL 2 MG/2ML IJ SOLN
INTRAMUSCULAR | Status: AC
  Filled 2021-02-25: qty 2

## 2021-02-25 SURGICAL SUPPLY — 48 items
BAG INFUSER PRESSURE 100CC (MISCELLANEOUS) IMPLANT
BLADE SURG SZ11 CARB STEEL (BLADE) ×2 IMPLANT
CANNULA REDUC XI 12-8 STAPL (CANNULA) ×1
CANNULA REDUCER 12-8 DVNC XI (CANNULA) ×1 IMPLANT
CLIP LIGATING HEMO O LOK GREEN (MISCELLANEOUS) ×2 IMPLANT
DECANTER SPIKE VIAL GLASS SM (MISCELLANEOUS) ×4 IMPLANT
DERMABOND ADVANCED (GAUZE/BANDAGES/DRESSINGS) ×1
DERMABOND ADVANCED .7 DNX12 (GAUZE/BANDAGES/DRESSINGS) ×1 IMPLANT
DRAPE ARM DVNC X/XI (DISPOSABLE) ×4 IMPLANT
DRAPE COLUMN DVNC XI (DISPOSABLE) ×1 IMPLANT
DRAPE DA VINCI XI ARM (DISPOSABLE) ×4
DRAPE DA VINCI XI COLUMN (DISPOSABLE) ×1
ELECT REM PT RETURN 9FT ADLT (ELECTROSURGICAL) ×2
ELECTRODE REM PT RTRN 9FT ADLT (ELECTROSURGICAL) ×1 IMPLANT
GLOVE SURG ENC MOIS LTX SZ6.5 (GLOVE) ×4 IMPLANT
GLOVE SURG UNDER POLY LF SZ6.5 (GLOVE) ×4 IMPLANT
GOWN STRL REUS W/ TWL LRG LVL3 (GOWN DISPOSABLE) ×3 IMPLANT
GOWN STRL REUS W/TWL LRG LVL3 (GOWN DISPOSABLE) ×3
GRASPER SUT TROCAR 14GX15 (MISCELLANEOUS) ×2 IMPLANT
IRRIGATOR SUCT 8 DISP DVNC XI (IRRIGATION / IRRIGATOR) IMPLANT
IRRIGATOR SUCTION 8MM XI DISP (IRRIGATION / IRRIGATOR)
IV NS 1000ML (IV SOLUTION)
IV NS 1000ML BAXH (IV SOLUTION) IMPLANT
KIT PINK PAD W/HEAD ARE REST (MISCELLANEOUS) ×2 IMPLANT
KIT PINK PAD W/HEAD ARM REST (MISCELLANEOUS) ×1 IMPLANT
LABEL OR SOLS (LABEL) ×2 IMPLANT
MANIFOLD NEPTUNE II (INSTRUMENTS) ×2 IMPLANT
NDL INSUFFLATION 14GA 120MM (NEEDLE) ×1 IMPLANT
NEEDLE HYPO 22GX1.5 SAFETY (NEEDLE) ×2 IMPLANT
NEEDLE INSUFFLATION 14GA 120MM (NEEDLE) ×2 IMPLANT
NS IRRIG 500ML POUR BTL (IV SOLUTION) ×2 IMPLANT
OBTURATOR OPTICAL STANDARD 8MM (TROCAR) ×1
OBTURATOR OPTICAL STND 8 DVNC (TROCAR) ×1
OBTURATOR OPTICALSTD 8 DVNC (TROCAR) ×1 IMPLANT
PACK LAP CHOLECYSTECTOMY (MISCELLANEOUS) ×2 IMPLANT
POUCH SPECIMEN RETRIEVAL 10MM (ENDOMECHANICALS) ×2 IMPLANT
SEAL CANN UNIV 5-8 DVNC XI (MISCELLANEOUS) ×3 IMPLANT
SEAL XI 5MM-8MM UNIVERSAL (MISCELLANEOUS) ×3
SET TUBE SMOKE EVAC HIGH FLOW (TUBING) ×2 IMPLANT
SOLUTION ELECTROLUBE (MISCELLANEOUS) ×2 IMPLANT
SPONGE T-LAP 4X18 ~~LOC~~+RFID (SPONGE) IMPLANT
STAPLER CANNULA SEAL DVNC XI (STAPLE) ×1 IMPLANT
STAPLER CANNULA SEAL XI (STAPLE) ×1
SUT MNCRL 4-0 (SUTURE) ×1
SUT MNCRL 4-0 27XMFL (SUTURE) ×1
SUT VICRYL 0 AB UR-6 (SUTURE) ×2 IMPLANT
SUTURE MNCRL 4-0 27XMF (SUTURE) ×1 IMPLANT
WATER STERILE IRR 500ML POUR (IV SOLUTION) ×2 IMPLANT

## 2021-02-25 NOTE — H&P (Signed)
SURGICAL CONSULTATION NOTE   HISTORY OF PRESENT ILLNESS (HPI):  36 y.o. female presented to [facility] ED for evaluation of abdominal pain. Patient reports started having abdominal pain since midnight.  Pain is on epigastric area radiated to the chest and into the right upper quadrant of the abdomen.  Pain also radiated to her back.  Patient cannot identify an aggravating factor.  Alleviating factor was pain medication at the ED.  Patient endorses nausea.  Denies fever.  At the ED she was found without leukocytosis.  Bilirubin within normal limits.  Abdominal ultrasound shows cholelithiasis and gallbladder with thickening.  It was consistent with cholecystitis.  I personally evaluated the images of the ultrasound.  Surgery is consulted by Dr. Alfred Levins in this context for evaluation and management of acute cholecystitis.  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 Authorizing Provider  Aspirin-Acetaminophen-Caffeine (EXCEDRIN EXTRA STRENGTH PO) Excedrin Extra Strength  PRN headache    [provider]  norethindrone (MICRONOR) 0.35 MG tablet Take 1 tablet (0.35 mg total) by mouth daily. [id number]   Copland, Deirdre Evener, PA-C     ALLERGIES:  Allergies  Allergen Reactions   Bee Venom Swelling   Doxycycline Rash    Possible rxn Possible rxn      SOCIAL HISTORY:  Social History   Socioeconomic History   Marital status: Single    Spouse name: Not on file   Number of children: Not on file   Years of education: Not on file   Highest education level: Not on file  Occupational History    Not on file  Tobacco Use   Smoking status: Never   Smokeless tobacco: Never  Vaping Use   Vaping Use: Never used  Substance and Sexual Activity   Alcohol use: Yes    Comment: occasionally   Drug use: Never   Sexual activity: Yes    Birth control/protection: None  Other Topics Concern   Not on file  Social History Narrative   Not on file   Social Determinants of Health   Financial Resource Strain: Not on file  Food Insecurity: Not on file  Transportation Needs: Not on file  Physical Activity: Not on file  Stress: Not on file  Social Connections: Not on file  Intimate Partner Violence: Not on file     FAMILY HISTORY:  Family History  Problem Relation Age of Onset   Healthy Mother    Healthy Father    Cancer Maternal Grandmother        ? type   Heart attack Maternal Grandfather    Cancer Paternal Grandfather        ? type   Parkinson's disease Paternal Grandfather      REVIEW OF SYSTEMS:  Constitutional: denies weight loss, fever, chills, or sweats  Eyes: denies any other vision changes, history of eye injury  ENT: denies sore throat, hearing problems  Respiratory: denies shortness of breath, wheezing  Cardiovascular: denies chest pain, palpitations  Gastrointestinal: positive abdominal pain, nausea Genitourinary: denies  burning with urination or urinary frequency Musculoskeletal: denies any other joint pains or cramps  Skin: denies any other rashes or skin discolorations  Neurological: denies any other headache, dizziness, weakness  Psychiatric: denies any other depression, anxiety   All other review of systems were negative   VITAL SIGNS:  Temp:  [97.9 F (36.6 C)] 97.9 F (36.6 C) (02/03 0611) Pulse Rate:  [73-86] 73 (02/03 0611) Resp:  [17-18] 17 (02/03 0611) BP: (126-152)/(86-102) 126/86 (02/03 0611) SpO2:  [98 %] 98 % (02/03 0611) Weight:  [110.7 kg] 110.7 kg (02/03 0239)       Weight: 110.7 kg     INTAKE/OUTPUT:  This shift: No intake/output  data recorded.  Last 2 shifts: @IOLAST2SHIFTS @   PHYSICAL EXAM:  Constitutional:  -- Normal body habitus  -- Awake, alert, and oriented x3  Eyes:  -- Pupils equally round and reactive to light  -- No scleral icterus  Ear, nose, and throat:  -- No jugular venous distension  Pulmonary:  -- No crackles  -- Equal breath sounds bilaterally -- Breathing non-labored at rest Cardiovascular:  -- S1, S2 present  -- No pericardial rubs Gastrointestinal:  -- Abdomen soft, tender in the right upper quadrant, non-distended, no guarding or rebound tenderness -- No abdominal masses appreciated, pulsatile or otherwise  Musculoskeletal and Integumentary:  -- Wounds: None appreciated -- Extremities: B/L UE and LE FROM, hands and feet warm, no edema  Neurologic:  -- Motor function: intact and symmetric -- Sensation: intact and symmetric   Labs:  CBC Latest Ref Rng & Units 02/25/2021  WBC 4.0 - 10.5 K/uL 8.1  Hemoglobin 12.0 - 15.0 g/dL 14.1  Hematocrit 36.0 - 46.0 % 42.6  Platelets 150 - 400 K/uL 342   CMP Latest Ref Rng & Units 02/25/2021  Glucose 70 - 99 mg/dL 113(H)  BUN 6 - 20 mg/dL 15  Creatinine 0.44 - 1.00 mg/dL 0.75  Sodium 135 - 145 mmol/L 134(L)  Potassium 3.5 - 5.1 mmol/L 3.6  Chloride 98 - 111 mmol/L 104  CO2 22 - 32 mmol/L 22  Calcium 8.9 - 10.3 mg/dL 8.7(L)  Total Protein 6.5 - 8.1 g/dL 8.0  Total Bilirubin 0.3 - 1.2 mg/dL 0.5  Alkaline Phos 38 - 126 U/L 54  AST 15 - 41 U/L 25  ALT 0 - 44 U/L 25   Imaging studies:  EXAM: ULTRASOUND ABDOMEN LIMITED RIGHT UPPER QUADRANT   COMPARISON:  None.   FINDINGS: Gallbladder:   Multiple gallstones identified measuring up to 1.9 cm. Sludge noted in the lumen is well. Gallbladder wall is thick at 4-5 mm. Patient has received pain medication limiting assessment for sonographic Murphy sign.   Common bile duct:   Diameter: 5 mm   Liver:   3.8 cm hyperechoic lesion identified in the right liver. No liver lesion visible on  noncontrast CT from 2007. Portal vein is patent on color Doppler imaging with normal direction of blood flow towards the liver.   Other: None.   Appearance of pylorus:   IMPRESSION: 1. Cholelithiasis with gallbladder wall thickening. Imaging features suggest acute cholecystitis. Clinical picture is equivocal, nuclear scintigraphy to assess for cystic duct obstruction may prove helpful. 2. 3.8 cm hyperechoic lesion right hepatic lobe. Primary differential considerations include hemangioma or potentially focal fatty infiltration. Nonemergent follow-up outpatient MRI without and with contrast recommended to confirm.      Electronically Signed   By: Misty Stanley M.D.   On: 02/25/2021 06:18  Assessment/Plan:  36 y.o. female  with acute cholecystitis, complicated by pertinent comorbidities including obesity, factor V [place].  Patient with history, physical exam and images consistent with acute cholecystitis. Patient oriented about diagnosis and surgical management as treatment.   Discussed the risk of surgery including post-op infxn, seroma, biloma, chronic pain, poor-delayed wound healing, retained gallstone, conversion to open procedure, post-op SBO or ileus, and need for additional procedures to address said risks.  The risks of general anesthetic including MI, CVA, sudden death or even reaction to anesthetic medications also discussed. Alternatives include continued observation.  Benefits include possible symptom relief, prevention of complications including acute cholecystitis, pancreatitis.  Arnold Long, MD

## 2021-02-25 NOTE — ED Notes (Signed)
Informed RN bed assigned 

## 2021-02-25 NOTE — Anesthesia Postprocedure Evaluation (Signed)
Anesthesia Post Note  Patient: Caitlin Cochran  Procedure(s) Performed: XI ROBOTIC ASSISTED LAPAROSCOPIC CHOLECYSTECTOMY INDOCYANINE GREEN FLUORESCENCE IMAGING (ICG)  Patient location during evaluation: PACU Anesthesia Type: General Level of consciousness: awake and alert Pain management: pain level controlled Vital Signs Assessment: post-procedure vital signs reviewed and stable Respiratory status: spontaneous breathing, nonlabored ventilation, respiratory function stable and patient connected to nasal cannula oxygen Cardiovascular status: blood pressure returned to baseline and stable Postop Assessment: no apparent nausea or vomiting Anesthetic complications: no   No notable events documented.   Last Vitals:  Vitals:   02/25/21 2305 02/25/21 2315  BP: 119/74   Pulse: 81   Resp: (!) 22 18  Temp: 36.8 C   SpO2: 96%     Last Pain:  Vitals:   02/25/21 2305  TempSrc: Oral  PainSc:                  Martha Clan

## 2021-02-25 NOTE — Transfer of Care (Signed)
Immediate Anesthesia Transfer of Care Note  Patient: Caitlin Cochran  Procedure(s) Performed: XI ROBOTIC ASSISTED LAPAROSCOPIC CHOLECYSTECTOMY INDOCYANINE GREEN FLUORESCENCE IMAGING (ICG)  Patient Location: PACU  Anesthesia Type:General  Level of Consciousness: drowsy  Airway & Oxygen Therapy: Patient Spontanous Breathing and Patient connected to face mask oxygen  Post-op Assessment: Report given to RN and Post -op Vital signs reviewed and stable  Post vital signs: Reviewed and stable  Last Vitals:  Vitals Value Taken Time  BP 122/64 02/25/21 2203  Temp    Pulse 93 02/25/21 2204  Resp 21 02/25/21 2204  SpO2 95 % 02/25/21 2204  Vitals shown include unvalidated device data.  Last Pain:  Vitals:   02/25/21 1529  TempSrc: Oral  PainSc: 2       Patients Stated Pain Goal: 2 ([id number])  Complications: No notable events documented.

## 2021-02-25 NOTE — Anesthesia Preprocedure Evaluation (Signed)
Anesthesia Evaluation  Patient identified by MRN, date of birth, ID band Patient awake    Reviewed: Allergy & Precautions, H&P , NPO status , Patient's Chart, lab work & pertinent test results, reviewed documented beta blocker date and time   History of Anesthesia Complications Negative for: history of anesthetic complications  Airway Mallampati: IV  TM Distance: >3 FB Neck ROM: full    Dental  (+) Dental Advidsory Given, Teeth Intact   Pulmonary neg pulmonary ROS,    Pulmonary exam normal breath sounds clear to auscultation       Cardiovascular Exercise Tolerance: Good negative cardio ROS Normal cardiovascular exam Rhythm:regular Rate:Normal     Neuro/Psych negative neurological ROS  negative psych ROS   GI/Hepatic negative GI ROS, Neg liver ROS,   Endo/Other  negative endocrine ROS  Renal/GU Renal disease (kidney stone)  negative genitourinary   Musculoskeletal   Abdominal   Peds  Hematology negative hematology ROS (+)   Anesthesia Other Findings Past Medical History: 01/2018: Chlamydia No date: DVT (deep venous thrombosis) (HCC)     Comment:  on OCPs, diagnosed with factor V Leiden No date: Factor V Leiden (HCC) No date: Kidney stone 01/2018: LGSIL on Pap smear of cervix  Obesity  Reproductive/Obstetrics negative OB ROS                             Anesthesia Physical Anesthesia Plan  ASA: 2  Anesthesia Plan: General   Post-op Pain Management:    Induction: Intravenous  PONV Risk Score and Plan: 3 and Ondansetron, Dexamethasone, Midazolam, Treatment may vary due to age or medical condition and Promethazine  Airway Management Planned: Oral ETT  Additional Equipment:   Intra-op Plan:   Post-operative Plan: Extubation in OR  Informed Consent: I have reviewed the patients History and Physical, chart, labs and discussed the procedure including the risks, benefits and  alternatives for the proposed anesthesia with the patient or authorized representative who has indicated his/her understanding and acceptance.     Dental Advisory Given  Plan Discussed with: Anesthesiologist, CRNA and Surgeon  Anesthesia Plan Comments:         Anesthesia Quick Evaluation

## 2021-02-25 NOTE — Anesthesia Procedure Notes (Signed)
Procedure Name: Intubation Date/Time: 02/25/2021 8:21 PM Performed by: Irving Burton, CRNA Pre-anesthesia Checklist: Patient identified, Patient being monitored, Timeout performed, Emergency Drugs available and Suction available Patient Re-evaluated:Patient Re-evaluated prior to induction Oxygen Delivery Method: Circle system utilized Preoxygenation: Pre-oxygenation with 100% oxygen Induction Type: IV induction Ventilation: Mask ventilation without difficulty Laryngoscope Size: 3 and McGraph Grade View: Grade I Tube type: Oral Tube size: 7.0 mm Number of attempts: 1 Airway Equipment and Method: Stylet and Video-laryngoscopy Placement Confirmation: ETT inserted through vocal cords under direct vision, positive ETCO2 and breath sounds checked- equal and bilateral Secured at: 21 cm Tube secured with: Tape Dental Injury: Teeth and Oropharynx as per pre-operative assessment

## 2021-02-25 NOTE — ED Triage Notes (Signed)
Woken up by chest pain around 0030. Pain radiates into her back. Emesis. Shortness of breath.

## 2021-02-25 NOTE — Op Note (Signed)
Preoperative diagnosis: Acute cholecystitis  Postoperative diagnosis: Acute cholecystitis  Procedure: Robotic Assisted Laparoscopic Cholecystectomy.   Anesthesia: GETA   Surgeon: Dr. Hazle Quant  Wound Classification: Clean Contaminated  Indications: Patient is a 36 y.o. female developed right upper quadrant pain and on workup was found to have cholelithiasis with a normal common duct and cholecystitis. Robotic Assisted Laparoscopic cholecystectomy was elected.  Findings: Abundant pericholecystic edema Critical view of safety achieved Cystic duct and artery identified, ligated and divided Adequate hemostasis         Description of procedure: The patient was placed on the operating table in the supine position. General anesthesia was induced. A time-out was completed verifying correct patient, procedure, site, positioning, and implant(s) and/or special equipment prior to beginning this procedure. An orogastric tube was placed. The abdomen was prepped and draped in the usual sterile fashion.  An incision was made in a natural skin line below the umbilicus.  The fascia was elevated and the Veress needle inserted. Proper position was confirmed by aspiration and saline meniscus test.  The abdomen was insufflated with carbon dioxide to a pressure of 15 mmHg. The patient tolerated insufflation well. A 8-mm trocar was then inserted in optiview fashion.  The laparoscope was inserted and the abdomen inspected. No injuries from initial trocar placement were noted. Additional trocars were then inserted in the following locations: an 8-mm trocar in the left lateral abdomen, and another two 8-mm trocars to the right side of the abdomen 5 cm appart. The umbilical trocar was changed to a 12 mm trocar all under direct visualization. The abdomen was inspected and no abnormalities were found. The table was placed in the reverse Trendelenburg position with the right side up. The robotic arms were docked  and target anatomy identified. Instrument inserted under direct visualization.  Filmy adhesions between the gallbladder and omentum, duodenum and transverse colon were lysed with electrocautery. The dome of the gallbladder was grasped with a prograsp and retracted over the dome of the liver. The infundibulum was also grasped with an atraumatic grasper and retracted toward the right lower quadrant. This maneuver exposed Calots triangle. The peritoneum overlying the gallbladder infundibulum was then incised and the cystic duct and cystic artery identified and circumferentially dissected. Critical view of safety reviewed before ligating any structure. Firefly images taken to visualize biliary ducts. The cystic duct and cystic artery were then doubly clipped and divided close to the gallbladder.  The gallbladder was then dissected from its peritoneal attachments by electrocautery. Hemostasis was checked and the gallbladder and contained stones were removed using an endoscopic retrieval bag. The gallbladder was passed off the table as a specimen. There was no evidence of bleeding from the gallbladder fossa or cystic artery or leakage of the bile from the cystic duct stump. Secondary trocars were removed under direct vision. No bleeding was noted. The robotic arms were undoked. The scope was withdrawn and the umbilical trocar removed. The abdomen was allowed to collapse. The fascia of the 59mm trocar sites was closed with figure-of-eight 0 vicryl sutures. The skin was closed with subcuticular sutures of 4-0 monocryl and topical skin adhesive. The orogastric tube was removed.  The patient tolerated the procedure well and was taken to the postanesthesia care unit in stable condition.   Specimen: Gallbladder  Complications: None  EBL: 10 mL

## 2021-02-25 NOTE — ED Provider Notes (Signed)
[facility] Provider Note    Event Date/Time   First MD Initiated Contact with Patient 02/25/21 [phone]     (approximate)   History   Chest Pain and Shortness of Breath   HPI  Caitlin Cochran is a 36 y.o. female with a history of factor V Leiden, prior DVT, not anticoagulated, kidney stones who presents for evaluation of epigastric abdominal pain.  Patient reports the pain woke her up from her sleep around 12:30 AM.  She describes the pain as stabbing and sharp located in the epigastric region radiating straight to her back.  Patient reports that the pain started radiating up to her chest which made her concerned.  She reports that its hard to take a deep breath because of the pain.  She has had nausea and one episode of nonbloody nonbilious emesis.  She denies ever having similar pain.  She denies any prior abdominal surgeries.  She denies any history of PE, no recent travel immobilization, no leg pain or swelling, no hemoptysis or exogenous hormones.  Her pain is severe in intensity.     Past Medical History:  Diagnosis Date   Chlamydia 01/2018   DVT (deep venous thrombosis) (HCC)    on OCPs, diagnosed with factor V Leiden   Factor V Leiden (HCC)    Kidney stone    LGSIL on Pap smear of cervix 01/2018    Past Surgical History:  Procedure Laterality Date   LITHOTRIPSY       Physical Exam   Triage Vital Signs: ED Triage Vitals  Enc Vitals Group     BP 02/25/21 0237 (!) 152/102     Pulse Rate 02/25/21 0237 86     Resp 02/25/21 0237 18     Temp 02/25/21 0237 97.9 F (36.6 C)     Temp src --      SpO2 02/25/21 0237 98 %     Weight 02/25/21 0239 244 lb (110.7 kg)     Height --      Head Circumference --      Peak Flow --      Pain Score 02/25/21 0238 7     Pain Loc --      Pain Edu? --      Excl. in GC? --     Most recent vital signs: Vitals:   02/25/21 0237 02/25/21 0611  BP: (!) 152/102 126/86  Pulse: 86 73  Resp: 18 17  Temp:  97.9 F (36.6 C) 97.9 F (36.6 C)  SpO2: 98% 98%     Constitutional: Alert and oriented. Well appearing and in no apparent distress. HEENT:      Head: Normocephalic and atraumatic.         Eyes: Conjunctivae are normal. Sclera is non-icteric.       Mouth/Throat: Mucous membranes are moist.       Neck: Supple with no signs of meningismus. Cardiovascular: Regular rate and rhythm. No murmurs, gallops, or rubs. 2+ symmetrical distal pulses are present in all extremities.  Respiratory: Normal respiratory effort. Lungs are clear to auscultation bilaterally.  Gastrointestinal: Soft, tender to palpation in the epigastric region and right upper quadrant with negative Murphy sign, and non distended with positive bowel sounds. No rebound or guarding. Genitourinary: No CVA tenderness. Musculoskeletal:  No edema, cyanosis, or erythema of extremities. Neurologic: Normal speech and language. Face is symmetric. Moving all extremities. No gross focal neurologic deficits are appreciated. Skin: Skin is warm, dry and intact. No  rash noted. Psychiatric: Mood and affect are normal. Speech and behavior are normal.  ED Results / Procedures / Treatments   Labs (all labs ordered are listed, but only abnormal results are displayed) Labs Reviewed  BASIC METABOLIC PANEL - Abnormal; Notable for the following components:      Result Value   Sodium 134 (*)    Glucose, Bld 113 (*)    Calcium 8.7 (*)    All other components within normal limits  URINALYSIS, COMPLETE (UACMP) WITH MICROSCOPIC - Abnormal; Notable for the following components:   Specific Gravity, Urine >1.030 (*)    Hgb urine dipstick LARGE (*)    Bilirubin Urine SMALL (*)    Ketones, ur TRACE (*)    Protein, ur 100 (*)    Leukocytes,Ua TRACE (*)    All other components within normal limits  RESP PANEL BY RT-PCR (FLU A&B, COVID) ARPGX2  CBC  HEPATIC FUNCTION PANEL  LIPASE, BLOOD  POC URINE PREG, ED  TROPONIN I (HIGH SENSITIVITY)  TROPONIN I  (HIGH SENSITIVITY)     EKG  ED ECG REPORT I, Nita Sicklearolina Shelva Hetzer, the attending physician, personally viewed and interpreted this ECG.  Normal sinus rhythm with a rate of 82, normal intervals, no ST elevations or depressions.   RADIOLOGY I, Nita Sicklearolina Sundee Garland, attending MD, have personally viewed and interpreted the images obtained during this visit as below:  Chest x-ray with no acute findings  Right upper quadrant ultrasound concerning for acute cholecystitis   ___________________________________________________ Interpretation by Radiologist:  DG Chest Portable 1 View  Result Date: 02/25/2021 CLINICAL DATA:  Chest pain EXAM: PORTABLE CHEST 1 VIEW COMPARISON:  06/27/2007 CT FINDINGS: The heart size and mediastinal contours are within normal limits. Both lungs are clear. The visualized skeletal structures are unremarkable. IMPRESSION: No active disease. Electronically Signed   By: Alcide CleverMark  Lukens M.D.   On: 02/25/2021 03:01   US ABDOMEN LIMITED RUQ (LIVER/GB)  Result Date: 02/25/2021 CLINICAL DATA:  Epigastric pain. EXAM: ULTRASOUND ABDOMEN LIMITED RIGHT UPPER QUADRANT COMPARISON:  None. FINDINGS: Gallbladder: Multiple gallstones identified measuring up to 1.9 cm. Sludge noted in the lumen is well. Gallbladder wall is thick at 4-5 mm. Patient has received pain medication limiting assessment for sonographic Murphy sign. Common bile duct: Diameter: 5 mm Liver: 3.8 cm hyperechoic lesion identified in the right liver. No liver lesion visible on noncontrast CT from 2007. Portal vein is patent on color Doppler imaging with normal direction of blood flow towards the liver. Other: None. Appearance of pylorus: IMPRESSION: 1. Cholelithiasis with gallbladder wall thickening. Imaging features suggest acute cholecystitis. Clinical picture is equivocal, nuclear scintigraphy to assess for cystic duct obstruction may prove helpful. 2. 3.8 cm hyperechoic lesion right hepatic lobe. Primary differential  considerations include hemangioma or potentially focal fatty infiltration. Nonemergent follow-up outpatient MRI without and with contrast recommended to confirm. If Electronically Signed   By: Kennith CenterEric  Mansell M.D.   On: 02/25/2021 06:18      PROCEDURES:  Critical Care performed: No  Procedures    IMPRESSION / MDM / ASSESSMENT AND PLAN / ED COURSE  I reviewed the triage vital signs and the nursing notes.  36 y.o. female with a history of factor V Leiden, prior DVT, not anticoagulated, kidney stones who presents for evaluation of epigastric abdominal pain.  Patient with sudden onset of epigastric abdominal pain, nausea and vomiting, pain radiating straight to her back and onto her chest.  She looks slightly uncomfortable but in no distress with normal vital signs.  Abdomen is nondistended and tender to palpation in the epigastric region and right upper quadrant with negative Murphy sign  Ddx: Pancreatitis versus gallbladder pathology versus peptic ulcer disease versus GERD versus PE specially with a history of factor V Leiden versus kidney stone   Plan: CBC, lipase, CMP, EKG, troponin, CXR, RUQ [place]. IV fentanyl and zofran for nausea and pain   MEDICATIONS GIVEN IN ED: Medications  lactated ringers bolus 1,000 mL (has no administration in time range)  lactated ringers infusion (has no administration in time range)  piperacillin-tazobactam (ZOSYN) IVPB 3.375 g (has no administration in time range)  ondansetron (ZOFRAN) injection 4 mg (4 mg Intravenous Given 02/25/21 0333)  fentaNYL (SUBLIMAZE) injection 50 mcg (50 mcg Intravenous Given 02/25/21 0333)     ED COURSE: Ultrasound consistent with acute cholecystitis.  Normal LFTs and lipase, normal white count, UA with some blood but patient is currently on her menstrual period.  EKG and troponins with no signs of ischemia.  After receiving IV fentanyl patient reports that her pain is 4 out of 10.  Will maintain patient n.p.o., initiate IV  hydration, give her IV Zosyn.  Surgery consulted and after discussing with the surgeon patient has been accepted to their service for surgical intervention.   Consults: Surgery   EMR reviewed including last visit with patient's gynecologist from March 2022 for an annual routine exam and last visit with her primary care doctor from 2020 for GERD    FINAL CLINICAL IMPRESSION(S) / ED DIAGNOSES   Final diagnoses:  Epigastric abdominal pain  Acute cholecystitis     Rx / DC Orders   ED Discharge Orders     None        Note:  This document was prepared using Dragon voice recognition software and may include unintentional dictation errors.   Please note:  Patient was evaluated in Emergency Department today for the symptoms described in the history of present illness. Patient was evaluated in the context of the global COVID-19 pandemic, which necessitated consideration that the patient might be at risk for infection with the SARS-CoV-2 virus that causes COVID-19. Institutional protocols and algorithms that pertain to the evaluation of patients at risk for COVID-19 are in a state of rapid change based on information released by regulatory bodies including the CDC and federal and state organizations. These policies and algorithms were followed during the patient's care in the ED.  Some ED evaluations and interventions may be delayed as a result of limited staffing during the pandemic.       Don Perking, [place], MD 02/25/21 [phone]

## 2021-02-26 LAB — HEPATIC FUNCTION PANEL
ALT: 69 U/L — ABNORMAL HIGH (ref 0–44)
AST: 74 U/L — ABNORMAL HIGH (ref 15–41)
Albumin: 3.3 g/dL — ABNORMAL LOW (ref 3.5–5.0)
Alkaline Phosphatase: 71 U/L (ref 38–126)
Bilirubin, Direct: 0.3 mg/dL — ABNORMAL HIGH (ref 0.0–0.2)
Indirect Bilirubin: 0.7 mg/dL (ref 0.3–0.9)
Total Bilirubin: 1 mg/dL (ref 0.3–1.2)
Total Protein: 6.6 g/dL (ref 6.5–8.1)

## 2021-02-26 LAB — CBC
HCT: 37.8 % (ref 36.0–46.0)
Hemoglobin: 12.5 g/dL (ref 12.0–15.0)
MCH: 29.5 pg (ref 26.0–34.0)
MCHC: 33.1 g/dL (ref 30.0–36.0)
MCV: 89.2 fL (ref 80.0–100.0)
Platelets: 275 10*3/uL (ref 150–400)
RBC: 4.24 MIL/uL (ref 3.87–5.11)
RDW: 12.2 % (ref 11.5–15.5)
WBC: 8.6 10*3/uL (ref 4.0–10.5)
nRBC: 0 % (ref 0.0–0.2)

## 2021-02-26 MED ORDER — SODIUM CHLORIDE 0.9 % IV SOLN: INTRAVENOUS | Status: DC | PRN

## 2021-02-26 MED ORDER — HYDROCODONE-ACETAMINOPHEN 5-325 MG PO TABS: 1.0000 | ORAL_TABLET | ORAL | 0 refills | Status: AC | PRN

## 2021-02-26 MED ORDER — FAMOTIDINE IN NACL 20-0.9 MG/50ML-% IV SOLN
20.0000 mg | Freq: Every day | INTRAVENOUS | Status: DC
  Filled 2021-02-26: qty 50

## 2021-02-26 MED ORDER — FAMOTIDINE IN NACL 20-0.9 MG/50ML-% IV SOLN
20.0000 mg | Freq: Every day | INTRAVENOUS | Status: DC
  Administered 2021-02-26: 20 mg via INTRAVENOUS
  Filled 2021-02-26 (×2): qty 50

## 2021-02-26 NOTE — TOC Transition Note (Addendum)
Transition of Care [facility]) - CM/SW Discharge Note   Patient Details  Name: JOLEAH KOSAK MRN: [id number] Date of Birth: September 08, 1985  [facility] CM/SW Contact:  Bing Quarry, RN Phone Number: 02/26/2021, 11:58 AM   Clinical Narrative: 2/4: Discharge to home/self care. From home. No TOC needs identified or in discharge plan. Gabriel Cirri RN CM   1600 Update: Patient DC canceled by provider due to increase in epigastric pain. Gabriel Cirri RN CM   Final next level of care: Home/Self Care Barriers to Discharge: Barriers Resolved   Patient Goals and CMS Choice     Choice offered to / list presented to : NA  Discharge Placement                       Discharge Plan and Services                DME Arranged: N/A DME Agency: NA       HH Arranged: NA HH Agency: NA        Social Determinants of Health (SDOH) Interventions     Readmission Risk Interventions No flowsheet data found.

## 2021-02-26 NOTE — Discharge Summary (Signed)
°  Patient ID: Caitlin Cochran MRN: [id number] DOB/AGE: [birth year] 35 y.o.  Admit date: 02/25/2021 Discharge date: 02/26/2021   Discharge Diagnoses:  Principal Problem:   Acute cholecystitis   Procedures: Robotic assisted laparoscopic cholecystectomy  Hospital Course: Patient with acute cholecystitis.  She underwent robotic assisted laparoscopic cholecystectomy.  This morning patient with pain control.  Patient tolerating diet.  Patient ambulating.  Incisions are dry and clean.  Physical Exam Constitutional:      Appearance: She is well-developed.  HENT:     Head: Normocephalic.  Pulmonary:     Effort: Pulmonary effort is normal.     Breath sounds: Normal breath sounds.  Abdominal:     General: Bowel sounds are normal.     Palpations: Abdomen is soft.  Skin:    General: Skin is warm.  Neurological:     Mental Status: She is alert and oriented to person, place, and time.     Consults: None  Disposition: Discharge disposition: 01-Home or Self Care       Discharge Instructions     Diet - low sodium heart healthy   Complete by: As directed    Increase activity slowly   Complete by: As directed       Allergies as of 02/26/2021       Reactions   Bee Venom Swelling   Doxycycline Rash   Possible rxn Possible rxn        Medication List     TAKE these medications    EXCEDRIN EXTRA STRENGTH PO Excedrin Extra Strength  PRN headache   HYDROcodone-acetaminophen 5-325 MG tablet Commonly known as: Norco Take 1 tablet by mouth every 4 (four) hours as needed for up to 3 days for moderate pain.   norethindrone 0.35 MG tablet Commonly known as: MICRONOR Take 1 tablet (0.35 mg total) by mouth daily.        Follow-up Information     Carolan Shiver, MD Follow up in 2 week(s).   Specialty: General Surgery Why: Follow up after cholecystectomy Contact information: [address] [place] [address] [phone]

## 2021-02-26 NOTE — Discharge Instructions (Signed)

## 2021-02-26 NOTE — Progress Notes (Addendum)
Patient ID: Caitlin Cochran, female   DOB: September 11, 1985, 36 y.o.   MRN: [id number]  Patient complaining of worsening epigastric pain. Feeling uncomfortable to go home. Will add Pepcid and continue with IV pain medications. New labs shows normal WBC count, Normal hemoglobin and expected mild increase in liver enzymes. Vital signs are within normal limits. Will hold discharge until pain improves.

## 2021-02-27 NOTE — Plan of Care (Signed)

## 2021-02-27 NOTE — Progress Notes (Signed)
Patient discharged to home via self/private transport Patient given discharge instructions with no follow-up questions . RN educated patient on medications and how to take them. Patient demonstrated understanding. Medications sent to [facility] pharmacy in [place], [place] Per patient request. PIVX2 removed. Patient VSS on discharge. No further complaints.

## 2021-02-27 NOTE — Discharge Summary (Signed)
°  Patient ID: Caitlin Cochran MRN: [id number] DOB/AGE: Mar 09, 1985 36 y.o.  Admit date: 02/25/2021 Discharge date: 02/27/2021   Discharge Diagnoses:  Principal Problem:   Acute cholecystitis   Procedures: Robotic assisted laparoscopic cholecystectomy  Hospital Course: Patient with acute cholecystitis she underwent robotic assisted laparoscopic cholecystectomy.  Initially she was recovering adequately and being discharged yesterday but after being discharged she developed severe epigastric pain and nausea.  She was treated with antiacid medications.  Her pain improved and has not recurred.  This morning patient without epigastric pain.  She is more comfortable mobilizing and getting out of bed.  The incisions are dry and clean.  Patient tolerating diet.  Physical Exam Cardiovascular:     Rate and Rhythm: Normal rate and regular rhythm.  Pulmonary:     Effort: Pulmonary effort is normal.     Breath sounds: Normal breath sounds.  Abdominal:     Palpations: Abdomen is soft.  Skin:    Capillary Refill: Capillary refill takes less than 2 seconds.  Neurological:     Mental Status: She is alert and oriented to person, place, and time.     Consults: None  Disposition: Discharge disposition: 01-Home or Self Care       Discharge Instructions     Diet - low sodium heart healthy   Complete by: As directed    Diet - low sodium heart healthy   Complete by: As directed    Increase activity slowly   Complete by: As directed    Increase activity slowly   Complete by: As directed       Allergies as of 02/27/2021       Reactions   Bee Venom Swelling   Doxycycline Rash   Possible rxn Possible rxn        Medication List     TAKE these medications    EXCEDRIN EXTRA STRENGTH PO Excedrin Extra Strength  PRN headache   HYDROcodone-acetaminophen 5-325 MG tablet Commonly known as: Norco Take 1 tablet by mouth every 4 (four) hours as needed for up to 3 days for moderate  pain.   norethindrone 0.35 MG tablet Commonly known as: MICRONOR Take 1 tablet (0.35 mg total) by mouth daily.        Follow-up Information     Carolan Shiver, MD Follow up in 2 week(s).   Specialty: General Surgery Why: Follow up after cholecystectomy Contact information: [address] [place] [address] [phone]

## 2021-02-27 NOTE — Plan of Care (Signed)
Pt c/o mild to moderate soreness at the incision sites on her abdomen with is well managed with PRN medication X1.   Pt did have one episode of emesis that was clear and small in volume when she got up to use the bathroom. PRN zofran with good control.    Continuous fluids maintained. Encouraging patient to take more oral fluids.    Problem: Education: Goal: Knowledge of General Education information will improve Description: Including pain rating scale, medication(s)/side effects and non-pharmacologic comfort measures Outcome: Progressing   Problem: Clinical Measurements: Goal: Will remain free from infection Outcome: Progressing   Problem: Activity: Goal: Risk for activity intolerance will decrease Outcome: Progressing

## 2021-02-27 NOTE — TOC Transition Note (Signed)
Transition of Care [facility]) - CM/SW Discharge Note   Patient Details  Name: Caitlin Cochran MRN: [id number] Date of Birth: [birth year]  [facility] CM/SW Contact:  Bing Quarry, RN Phone Number: 02/27/2021, 12:57 PM   Clinical Narrative:  2/5: Discharge to home/self care today. From home. No TOC needs identified or in discharge plan. Gabriel Cirri RN CM      Final next level of care: Home/Self Care Barriers to Discharge: Barriers Resolved   Patient Goals and CMS Choice     Choice offered to / list presented to : NA  Discharge Placement                       Discharge Plan and Services                DME Arranged: N/A DME Agency: NA       HH Arranged: NA HH Agency: NA        Social Determinants of Health (SDOH) Interventions     Readmission Risk Interventions No flowsheet data found.

## 2021-03-01 LAB — SURGICAL PATHOLOGY

## 2021-11-10 ENCOUNTER — Ambulatory Visit
Admission: EM | Admit: 2021-11-10 | Discharge: 2021-11-10 | Disposition: A | Discharge status: Home / Self Care | Payer: BC Managed Care – PPO | Attending: Physician Assistant | Admitting: Physician Assistant

## 2021-11-10 ENCOUNTER — Other Ambulatory Visit: Payer: Self-pay

## 2021-11-10 DIAGNOSIS — R051 Acute cough: Secondary | ICD-10-CM | POA: Diagnosis not present

## 2021-11-10 DIAGNOSIS — J069 Acute upper respiratory infection, unspecified: Secondary | ICD-10-CM

## 2021-11-10 DIAGNOSIS — R0981 Nasal congestion: Secondary | ICD-10-CM

## 2021-11-10 MED ORDER — PSEUDOEPH-BROMPHEN-DM 30-2-10 MG/5ML PO SYRP: 10.0000 mL | ORAL_SOLUTION | Freq: Four times a day (QID) | ORAL | 0 refills | Status: AC | PRN

## 2021-11-10 MED ORDER — IPRATROPIUM BROMIDE 0.06 % NA SOLN: 2.0000 | Freq: Four times a day (QID) | NASAL | 0 refills | Status: DC

## 2021-11-10 NOTE — ED Provider Notes (Signed)
[facility]    CSN: [id number] Arrival date & time: 11/10/21  3662      History   Chief Complaint Chief Complaint  Patient presents with   Facial Pain   Nasal Congestion    HPI Caitlin Cochran is a 36 y.o. female presenting for 5-day history of fatigue, headaches, nasal congestion, sinus pressure.  Patient states that about 3 days ago she started to have a cough productive of greenish phlegm.  She says at onset she had a sore throat but that has resolved and the sinus pressure has gotten better but it is still pretty bad.  She reports that she is most bothered by her cough at this time.  She has not noticed any fevers and denies any chest pain or breathing difficulty.  No complaint of sore throat or ear pain either.  Has been taking over-the-counter Sudafed.  Reports that she has been around a couple of people who have sinus infections but no known COVID or flu exposure and she is taken for COVID test at home and they have all been negative.  No other complaints.   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 Authorizing Provider  brompheniramine-pseudoephedrine-DM 30-2-10 MG/5ML syrup Take 10 mLs by mouth 4 (four) times daily as needed for up to 7 days. 11/10/21 11/17/21 Yes Laurene Footman B, PA-C   ipratropium (ATROVENT) 0.06 % nasal spray Place 2 sprays into both nostrils 4 (four) times daily. 11/10/21  Yes Danton Clap, PA-C  Aspirin-Acetaminophen-Caffeine (EXCEDRIN EXTRA STRENGTH PO) Excedrin Extra Strength  PRN headache    [provider]  norethindrone (MICRONOR) 0.35 MG tablet Take 1 tablet (0.35 mg total) by mouth daily. 9/47/65   Copland, Deirdre Evener, PA-C    Family History Family History  Problem Relation Age of Onset   Healthy Mother    Healthy Father    Cancer Maternal Grandmother        ? type   Heart attack Maternal Grandfather    Cancer Paternal Grandfather        ? type   Parkinson's disease Paternal Grandfather     Social History Social History   Tobacco Use   Smoking status: Never   Smokeless tobacco: Never  Vaping Use   Vaping Use: Never used  Substance Use Topics   Alcohol use: Yes    Comment: occasionally   Drug use: Never     Allergies   Bee venom and  Doxycycline   Review of Systems Review of Systems  Constitutional:  Positive for fatigue. Negative for chills, diaphoresis and fever.  HENT:  Positive for congestion, rhinorrhea, sinus pressure and sinus pain. Negative for ear pain and sore throat.   Eyes:  Positive for pain.  Respiratory:  Positive for cough. Negative for shortness of breath.   Cardiovascular:  Negative for chest pain.  Gastrointestinal:  Negative for abdominal pain, nausea and vomiting.  Musculoskeletal:  Negative for arthralgias and myalgias.  Skin:  Negative for rash.  Neurological:  Positive for headaches. Negative for weakness.  Hematological:  Negative for adenopathy.     Physical Exam Triage Vital Signs ED Triage Vitals  Enc Vitals Group     BP 11/10/21 1032 137/89     Pulse Rate 11/10/21 1032 82     Resp 11/10/21 1032 16     Temp 11/10/21 1032 98.2 F (36.8 C)     Temp Source 11/10/21 1032 Oral     SpO2 11/10/21 1032 100 %     Weight 11/10/21 1033 247 lb (112 kg)     Height 11/10/21 1033 5'  7" (1.702 m)     Head Circumference --      Peak Flow --      Pain Score 11/10/21 1031 3     Pain Loc --      Pain Edu? --      Excl. in [place]? --    No data found.  Updated Vital Signs BP 137/89 (BP Location: Left Arm)   Pulse 82   Temp 98.2 F (36.8 C) (Oral)   Resp 16   Ht 5\' 7"  (1.702 m)   Wt 247 lb (112 kg)   LMP 11/09/2021   SpO2 100%   BMI 38.69 kg/m   Physical Exam Vitals and nursing note reviewed.  Constitutional:      General: She is not in acute distress.    Appearance: Normal appearance. She is ill-appearing. She is not toxic-appearing.  HENT:     Head: Normocephalic and atraumatic.     Right Ear: Tympanic membrane, ear canal and external ear normal.     Left Ear: Tympanic membrane, ear canal and external ear normal.     Nose: Congestion present.     Mouth/Throat:     Mouth: Mucous membranes are moist.     Pharynx: Oropharynx is clear.  Eyes:     General: No scleral icterus.       Right eye: No discharge.        Left eye: No discharge.     Conjunctiva/sclera: Conjunctivae normal.  Cardiovascular:     Rate and Rhythm: Normal rate and regular rhythm.     Heart sounds: Normal heart sounds.  Pulmonary:     Effort: Pulmonary effort is normal. No respiratory distress.     Breath sounds: Normal breath sounds.  Musculoskeletal:     Cervical back: Neck supple.  Skin:    General: Skin is dry.  Neurological:     General: No focal deficit present.     Mental Status: She is alert. Mental status is at baseline.     Motor: No weakness.     Gait: Gait normal.  Psychiatric:        Mood and Affect: Mood normal.        Behavior: Behavior normal.        Thought Content: Thought content normal.      UC Treatments / Results  Labs (all labs  ordered are listed, but only abnormal results are displayed) Labs Reviewed - No data to display  EKG   Radiology No results found.  Procedures Procedures (including critical care time)  Medications Ordered in  UC Medications - No data to display  Initial Impression / Assessment and Plan / UC Course  I have reviewed the triage vital signs and the nursing notes.  Pertinent labs & imaging results that were available during my care of the patient were reviewed by me and considered in my medical decision making (see chart for details).   36 year old female presenting for 5-day history of sinus pressure, headaches, congestion and fatigue.  Reports productive cough that began 3 days ago.  Symptoms have improved a little from onset.  Patient most bothered by her cough at this point.  No fever or breathing difficulty.  Vitals normal and stable.  Patient is ill-appearing but nontoxic.  On exam she does have significant nasal congestion and blows her nose frequently.  Throat is clear.  Chest clear to auscultation heart regular rate and rhythm.  Advised patient symptoms consistent with viral URI.  Supportive care encouraged.  Sent Bromfed-DM to pharmacy as well as Atrovent nasal spray and encouraged her to use Motrin over-the-counter and Tylenol as needed for headaches and sinus pain.  Advised to return if she is not feeling better over the next week or if she develops a fever, has worsening sinus pain, chest pain or breathing difficulty.   Final Clinical Impressions(s) / UC Diagnoses   Final diagnoses:  Viral upper respiratory tract infection  Nasal congestion  Acute cough     Discharge Instructions      URI/COLD SYMPTOMS: Your exam today is consistent with a viral illness. Antibiotics are not indicated at this time. Use medications as directed, including cough syrup, nasal saline, and decongestants. Your symptoms should improve over the next few days and resolve within another 7-10 days. Increase rest and fluids. F/u if symptoms worsen or predominate such as sore throat, ear pain, productive cough, shortness of breath, or if you develop high fevers or worsening fatigue over the next several days.        ED Prescriptions     Medication Sig Dispense Auth. Provider   brompheniramine-pseudoephedrine-DM 30-2-10 MG/5ML syrup Take 10 mLs by mouth 4 (four) times daily as needed for up to 7 days. 150 mL Laurene Footman B, PA-C   ipratropium (ATROVENT) 0.06 % nasal spray Place 2 sprays into both nostrils 4 (four) times daily. 15 mL Danton Clap, PA-C      PDMP not reviewed this encounter.   Danton Clap, PA-C 11/10/21 1100

## 2021-11-10 NOTE — Discharge Instructions (Signed)

## 2021-11-10 NOTE — ED Triage Notes (Addendum)
Pt with 5 days of facial pain and pressure. Pressure above eyes. Coughing up green phlegm. "My eyes have been burning."

## 2021-11-23 ENCOUNTER — Telehealth: Payer: BC Managed Care – PPO | Admitting: Physician Assistant

## 2021-11-23 DIAGNOSIS — U071 COVID-19: Secondary | ICD-10-CM

## 2021-11-23 MED ORDER — NIRMATRELVIR/RITONAVIR (PAXLOVID)TABLET: 3.0000 | ORAL_TABLET | Freq: Two times a day (BID) | ORAL | 0 refills | Status: AC

## 2021-11-23 MED ORDER — BENZONATATE 100 MG PO CAPS: 100.0000 mg | ORAL_CAPSULE | Freq: Three times a day (TID) | ORAL | 0 refills | Status: AC | PRN

## 2021-11-23 NOTE — Progress Notes (Signed)
Virtual Visit Consent   Caitlin Cochran, you are scheduled for a virtual visit with a [facility] provider today. Just as with appointments in the office, your consent must be obtained to participate. Your consent will be active for this visit and any virtual visit you may have with one of our providers in the next 365 days. If you have a MyChart account, a copy of this consent can be sent to you electronically.  As this is a virtual visit, video technology does not allow for your provider to perform a traditional examination. This may limit your provider's ability to fully assess your condition. If your provider identifies any concerns that need to be evaluated in person or the need to arrange testing (such as labs, EKG, etc.), we will make arrangements to do so. Although advances in technology are sophisticated, we cannot ensure that it will always work on either your end or our end. If the connection with a video visit is poor, the visit may have to be switched to a telephone visit. With either a video or telephone visit, we are not always able to ensure that we have a secure connection.  By engaging in this virtual visit, you consent to the provision of healthcare and authorize for your insurance to be billed (if applicable) for the services provided during this visit. Depending on your insurance coverage, you may receive a charge related to this service.  I need to obtain your verbal consent now. Are you willing to proceed with your visit today? Caitlin Cochran has provided verbal consent on 11/23/2021 for a virtual visit (video or telephone). Leeanne Rio, [place]  Date: 11/23/2021 12:31 PM  Virtual Visit via Video Note   I, Leeanne Rio, connected with  Caitlin Cochran  ([id number], [birth year]) on 11/23/21 at 12:30 PM EDT by a video-enabled telemedicine application and verified that I am speaking with the correct person using two identifiers.  Location: Patient:  Virtual Visit Location Patient: Home Provider: Virtual Visit Location Provider: Home Office   I discussed the limitations of evaluation and management by telemedicine and the availability of in person appointments. The patient expressed understanding and agreed to proceed.    History of Present Illness: Caitlin Cochran is a 36 y.o. who identifies as a female who was assigned female at birth, and is being seen today for COVID-19. Endorses symptoms starting yesterday while at work with chills, aches and fatigue, followed by a headache. Was tested for COVID at work and negative but sent home to rest. Notes fever with Tmax 103 overnight that has resolved. Still with congestion, cough. Denies GI symptoms. Denies chest pain or overt SOB. Mild windedness with exertion. Is taking OTC Advil. Retested for COVID this morning and positive.   HPI: HPI  Problems:  Patient Active Problem List   Diagnosis Date Noted   Acute cholecystitis 02/25/2021   LGSIL on Pap smear of cervix 04/21/2020    Allergies:  Allergies  Allergen Reactions   Bee Venom Swelling   Doxycycline Rash    Possible rxn Possible rxn    Medications:  Current Outpatient Medications:    benzonatate (TESSALON) 100 MG capsule, Take 1 capsule (100 mg total) by mouth 3 (three) times daily as needed for cough., Disp: 30 capsule, Rfl: 0   nirmatrelvir/ritonavir EUA (PAXLOVID) 20 x 150 MG & 10 x 100MG  TABS, Take 3 tablets by mouth 2 (two) times daily for 5 days. (Take nirmatrelvir 150 mg two tablets twice daily  for 5 days and ritonavir 100 mg one tablet twice daily for 5 days) Patient GFR is >60, Disp: 30 tablet, Rfl: 0   Aspirin-Acetaminophen-Caffeine (EXCEDRIN EXTRA STRENGTH PO), Excedrin Extra Strength  PRN headache, Disp: , Rfl:   Observations/Objective: Patient is well-developed, well-nourished in no acute distress.  Resting comfortably at home.  Head is normocephalic, atraumatic.  No labored breathing. Speech is clear and  coherent with logical content.  Patient is alert and oriented at baseline.   Assessment and Plan: 1. COVID-19 - benzonatate (TESSALON) 100 MG capsule; Take 1 capsule (100 mg total) by mouth 3 (three) times daily as needed for cough.  Dispense: 30 capsule; Refill: 0 - nirmatrelvir/ritonavir EUA (PAXLOVID) 20 x 150 MG & 10 x 100MG  TABS; Take 3 tablets by mouth 2 (two) times daily for 5 days. (Take nirmatrelvir 150 mg two tablets twice daily for 5 days and ritonavir 100 mg one tablet twice daily for 5 days) Patient GFR is >60  Dispense: 30 tablet; Refill: 0  Patient with multiple risk factors for complicated course of illness. Discussed risks/benefits of antiviral medications including most common potential ADRs. Patient voiced understanding and would like to proceed with antiviral medication. They are candidate for Paxlovid. Rx sent to pharmacy. Supportive measures, OTC medications and vitamin regimen reviewed. Tessalon per orders. Patient has been enrolled in a MyChart COVID symptom monitoring program. reviewed in detail. Strict ER precautions discussed with patient.    Follow Up Instructions: I discussed the assessment and treatment plan with the patient. The patient was provided an opportunity to ask questions and all were answered. The patient agreed with the plan and demonstrated an understanding of the instructions.  A copy of instructions were sent to the patient via MyChart unless otherwise noted below.   The patient was advised to call back or seek an in-person evaluation if the symptoms worsen or if the condition fails to improve as anticipated.  Time:  I spent 10 minutes with the patient via telehealth technology discussing the above problems/concerns.    Anne Shutter, PA-C

## 2021-11-23 NOTE — Patient Instructions (Signed)
Royal Piedra, thank you for joining Piedad Climes, PA-C for today's virtual visit.  While this provider is not your primary care provider (PCP), if your PCP is located in our provider database this encounter information will be shared with them immediately following your visit.   A [facility] MyChart account gives you access to today's visit and all your visits, tests, and labs performed at [facility] " click here if you don't have a [facility] MyChart account or go to [email or web address]  Consent: (Patient) Caitlin Cochran provided verbal consent for this virtual visit at the beginning of the encounter.  Current Medications:  Current Outpatient Medications:    Aspirin-Acetaminophen-Caffeine (EXCEDRIN EXTRA STRENGTH PO), Excedrin Extra Strength  PRN headache, Disp: , Rfl:    ipratropium (ATROVENT) 0.06 % nasal spray, Place 2 sprays into both nostrils 4 (four) times daily., Disp: 15 mL, Rfl: 0   norethindrone (MICRONOR) 0.35 MG tablet, Take 1 tablet (0.35 mg total) by mouth daily., Disp: 84 tablet, Rfl: 3   Medications ordered in this encounter:  No orders of the defined types were placed in this encounter.    *If you need refills on other medications prior to your next appointment, please contact your pharmacy*  Follow-Up: Call back or seek an in-person evaluation if the symptoms worsen or if the condition fails to improve as anticipated.  [facility] [phone]  Other Instructions Please keep well-hydrated and get plenty of rest. Start a saline nasal rinse to flush out your nasal passages. You can use plain Mucinex to help thin congestion. If you have a humidifier, running in the bedroom at night. I want you to start OTC vitamin D3 1000 units daily, vitamin C 1000 mg daily, and a zinc supplement. Please take prescribed medications as directed.  You have been enrolled in a MyChart symptom monitoring program. Please answer  these questions daily so we can keep track of how you are doing.  You were to quarantine for 5 days from onset of your symptoms.  After day 5, if you have had no fever and you are feeling better, you can end quarantine but need to mask for an additional 5 days. After day 5 if you have a fever or are having significant symptoms, please quarantine for full 10 days.  If you note any worsening of symptoms, any significant shortness of breath or any chest pain, please seek ER evaluation ASAP.  Please do not delay care!  COVID-19: What to Do if You Are Sick If you test positive and are an older adult or someone who is at high risk of getting very sick from COVID-19, treatment may be available. Contact a healthcare provider right away after a positive test to determine if you are eligible, even if your symptoms are mild right now. You can also visit a Test to Treat location and, if eligible, receive a prescription from a provider. Don't delay: Treatment must be started within the first few days to be effective. If you have a fever, cough, or other symptoms, you might have COVID-19. Most people have mild illness and are able to recover at home. If you are sick: Keep track of your symptoms. If you have an emergency warning sign (including trouble breathing), call 911. Steps to help prevent the spread of COVID-19 if you are sick If you are sick with COVID-19 or think you might have COVID-19, follow the steps below to care for yourself and to help protect other people  in your home and community. Stay home except to get medical care Stay home. Most people with COVID-19 have mild illness and can recover at home without medical care. Do not leave your home, except to get medical care. Do not visit public areas and do not go to places where you are unable to wear a mask. Take care of yourself. Get rest and stay hydrated. Take over-the-counter medicines, such as acetaminophen, to help you feel better. Stay in touch  with your doctor. Call before you get medical care. Be sure to get care if you have trouble breathing, or have any other emergency warning signs, or if you think it is an emergency. Avoid public transportation, ride-sharing, or taxis if possible. Get tested If you have symptoms of COVID-19, get tested. While waiting for test results, stay away from others, including staying apart from those living in your household. Get tested as soon as possible after your symptoms start. Treatments may be available for people with COVID-19 who are at risk for becoming very sick. Don't delay: Treatment must be started early to be effective--some treatments must begin within 5 days of your first symptoms. Contact your healthcare provider right away if your test result is positive to determine if you are eligible. Self-tests are one of several options for testing for the virus that causes COVID-19 and may be more convenient than laboratory-based tests and point-of-care tests. Ask your healthcare provider or your local health department if you need help interpreting your test results. You can visit your state, tribal, local, and territorial health department's website to look for the latest local information on testing sites. Separate yourself from other people As much as possible, stay in a specific room and away from other people and pets in your home. If possible, you should use a separate bathroom. If you need to be around other people or animals in or outside of the home, wear a well-fitting mask. Tell your close contacts that they may have been exposed to COVID-19. An infected person can spread COVID-19 starting 48 hours (or 2 days) before the person has any symptoms or tests positive. By letting your close contacts know they may have been exposed to COVID-19, you are helping to protect everyone. See COVID-19 and Animals if you have questions about pets. If you are diagnosed with COVID-19, someone from the health  department may call you. Answer the call to slow the spread. Monitor your symptoms Symptoms of COVID-19 include fever, cough, or other symptoms. Follow care instructions from your healthcare provider and local health department. Your local health authorities may give instructions on checking your symptoms and reporting information. When to seek emergency medical attention Look for emergency warning signs* for COVID-19. If someone is showing any of these signs, seek emergency medical care immediately: Trouble breathing Persistent pain or pressure in the chest New confusion Inability to wake or stay awake Pale, gray, or blue-colored skin, lips, or nail beds, depending on skin tone *This list is not all possible symptoms. Please call your medical provider for any other symptoms that are severe or concerning to you. Call 911 or call ahead to your local emergency facility: Notify the operator that you are seeking care for someone who has or may have COVID-19. Call ahead before visiting your doctor Call ahead. Many medical visits for routine care are being postponed or done by phone or telemedicine. If you have a medical appointment that cannot be postponed, call your doctor's office, and tell them you have  or may have COVID-19. This will help the office protect themselves and other patients. If you are sick, wear a well-fitting mask You should wear a mask if you must be around other people or animals, including pets (even at home). Wear a mask with the best fit, protection, and comfort for you. You don't need to wear the mask if you are alone. If you can't put on a mask (because of trouble breathing, for example), cover your coughs and sneezes in some other way. Try to stay at least 6 feet away from other people. This will help protect the people around you. Masks should not be placed on young children under age 22 years, anyone who has trouble breathing, or anyone who is not able to remove the mask  without help. Cover your coughs and sneezes Cover your mouth and nose with a tissue when you cough or sneeze. Throw away used tissues in a lined trash can. Immediately wash your hands with soap and water for at least 20 seconds. If soap and water are not available, clean your hands with an alcohol-based hand sanitizer that contains at least 60% alcohol. Clean your hands often Wash your hands often with soap and water for at least 20 seconds. This is especially important after blowing your nose, coughing, or sneezing; going to the bathroom; and before eating or preparing food. Use hand sanitizer if soap and water are not available. Use an alcohol-based hand sanitizer with at least 60% alcohol, covering all surfaces of your hands and rubbing them together until they feel dry. Soap and water are the best option, especially if hands are visibly dirty. Avoid touching your eyes, nose, and mouth with unwashed hands. Handwashing Tips Avoid sharing personal household items Do not share dishes, drinking glasses, cups, eating utensils, towels, or bedding with other people in your home. Wash these items thoroughly after using them with soap and water or put in the dishwasher. Clean surfaces in your home regularly Clean and disinfect high-touch surfaces (for example, doorknobs, tables, handles, light switches, and countertops) in your "sick room" and bathroom. In shared spaces, you should clean and disinfect surfaces and items after each use by the person who is ill. If you are sick and cannot clean, a caregiver or other person should only clean and disinfect the area around you (such as your bedroom and bathroom) on an as needed basis. Your caregiver/other person should wait as long as possible (at least several hours) and wear a mask before entering, cleaning, and disinfecting shared spaces that you use. Clean and disinfect areas that may have blood, stool, or body fluids on them. Use household cleaners and  disinfectants. Clean visible dirty surfaces with household cleaners containing soap or detergent. Then, use a household disinfectant. Use a product from [employer] List N: Disinfectants for Coronavirus (COVID-19). Be sure to follow the instructions on the label to ensure safe and effective use of the product. Many products recommend keeping the surface wet with a disinfectant for a certain period of time (look at "contact time" on the product label). You may also need to wear personal protective equipment, such as gloves, depending on the directions on the product label. Immediately after disinfecting, wash your hands with soap and water for 20 seconds. For completed guidance on cleaning and disinfecting your home, visit Complete Disinfection Guidance. Take steps to improve ventilation at home Improve ventilation (air flow) at home to help prevent from spreading COVID-19 to other people in your household. Clear out COVID-19  virus particles in the air by opening windows, using air filters, and turning on fans in your home. Use this interactive tool to learn how to improve air flow in your home. When you can be around others after being sick with COVID-19 Deciding when you can be around others is different for different situations. Find out when you can safely end home isolation. For any additional questions about your care, contact your healthcare provider or state or local health department. 04/13/2020 Content source: [facility] ([facility]), Division of Viral Diseases This information is not intended to replace advice given to you by your health care provider. Make sure you discuss any questions you have with your health care provider. Document Revised: 05/27/2020 Document Reviewed: 05/27/2020 Elsevier Patient Education  2022 [employer].      If you have been instructed to have an in-person evaluation today at a local Urgent Care facility, please use  the link below. It will take you to a list of all of our available [facility], including address, phone number and hours of operation. Please do not delay care.  [facility]  If you or a family member do not have a primary care provider, use the link below to schedule a visit and establish care. When you choose a [facility] primary care physician or advanced practice provider, you gain a long-term partner in health. Find a Primary Care Provider  Learn more about [facility]'s in-office and virtual care options: [facility]

## 2022-02-26 IMAGING — US US ABDOMEN LIMITED
1 series · 14 of 25 positions shown · non-contrast
Comparison: None.

CLINICAL DATA: Epigastric pain.

EXAM:
ULTRASOUND ABDOMEN LIMITED RIGHT UPPER QUADRANT

[Series 1: us abdomen limited ruq (liver/gb) · 14 of 50 slices shown]
[im 1/50]
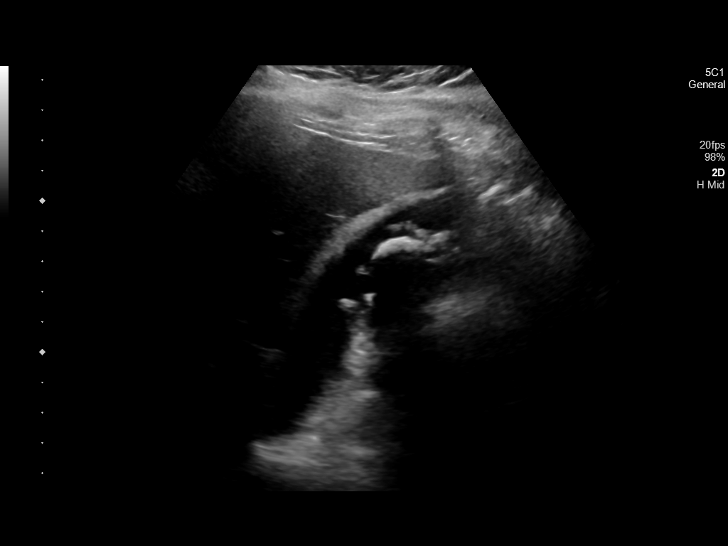
[im 5/50]
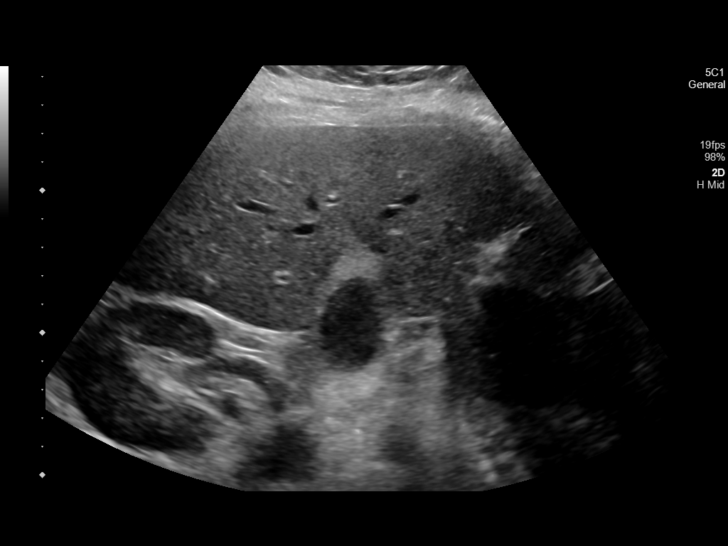
[im 9/50]
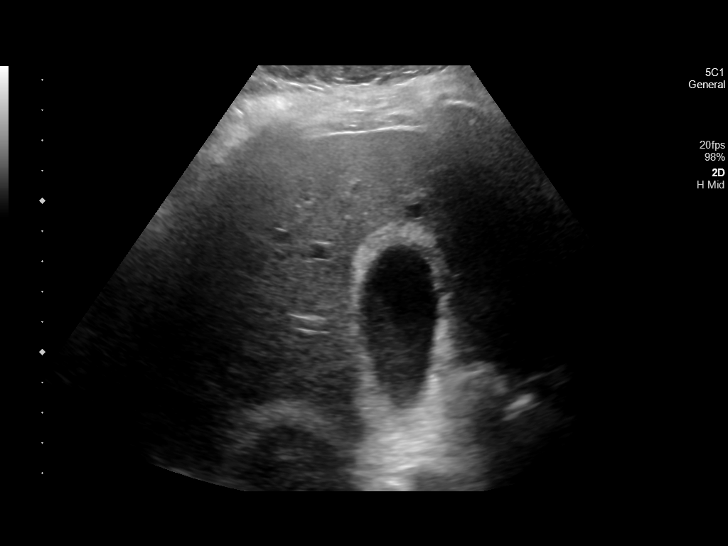
[im 13/50]
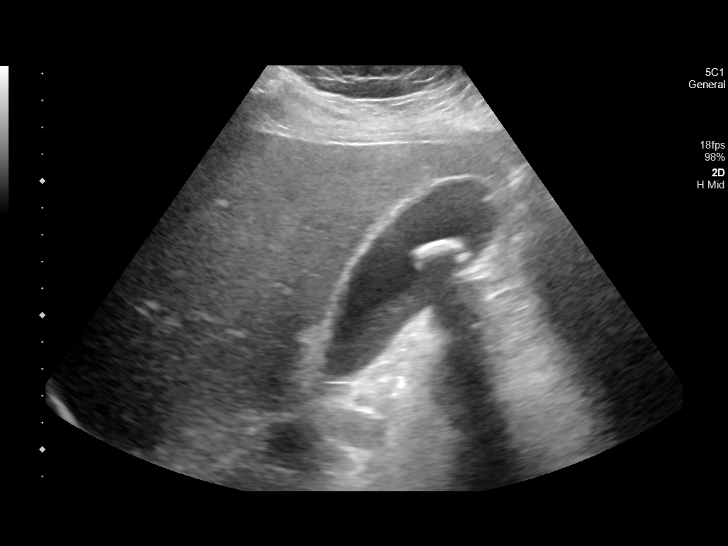
[im 17/50]
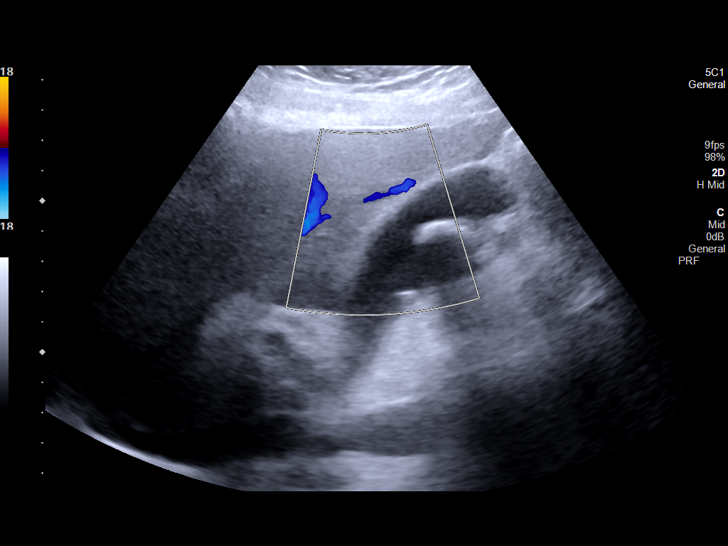
[im 19/50]
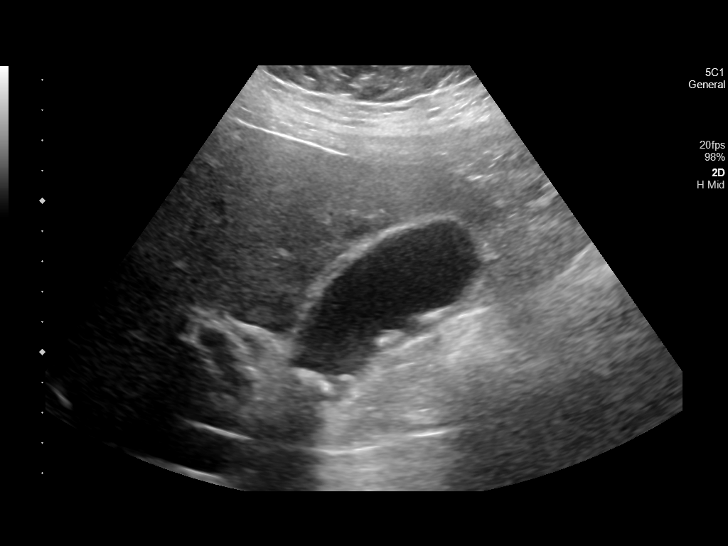
[im 23/50]
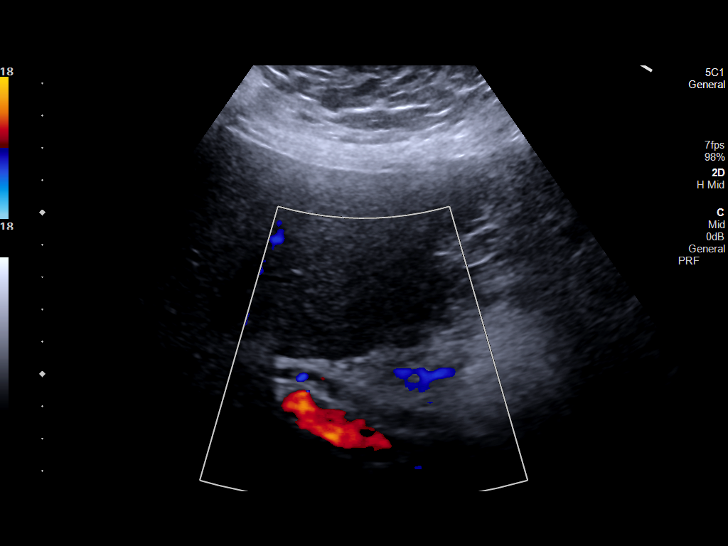
[im 27/50]
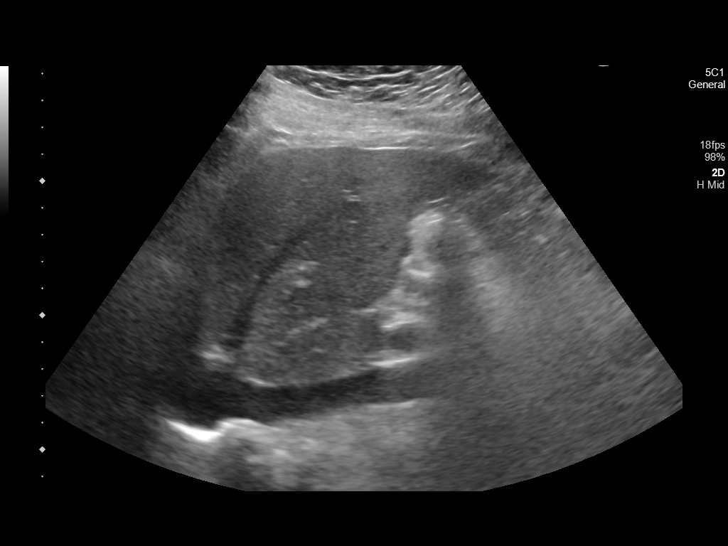
[im 31/50]
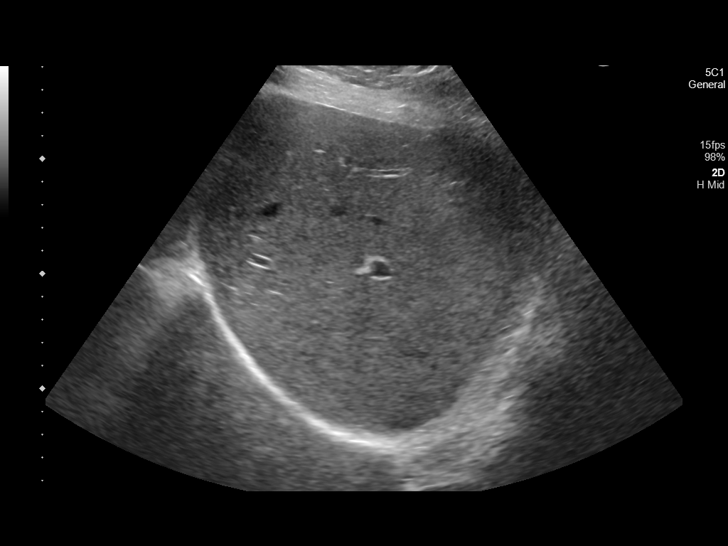
[im 33/50]
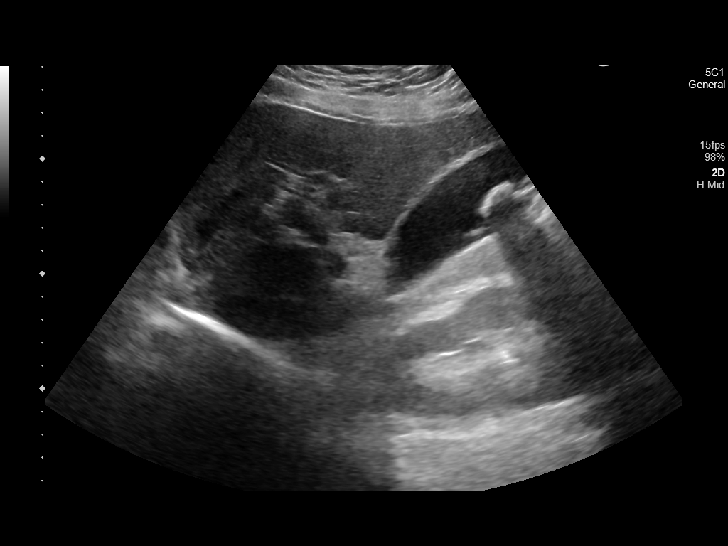
[im 37/50]
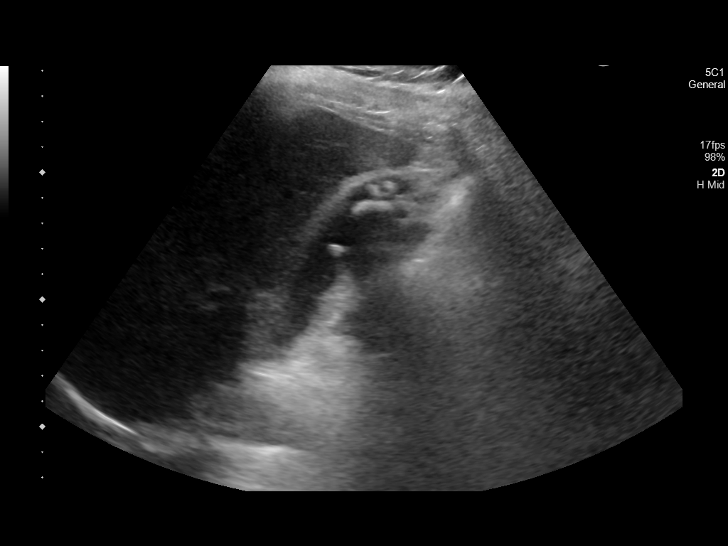
[im 41/50]
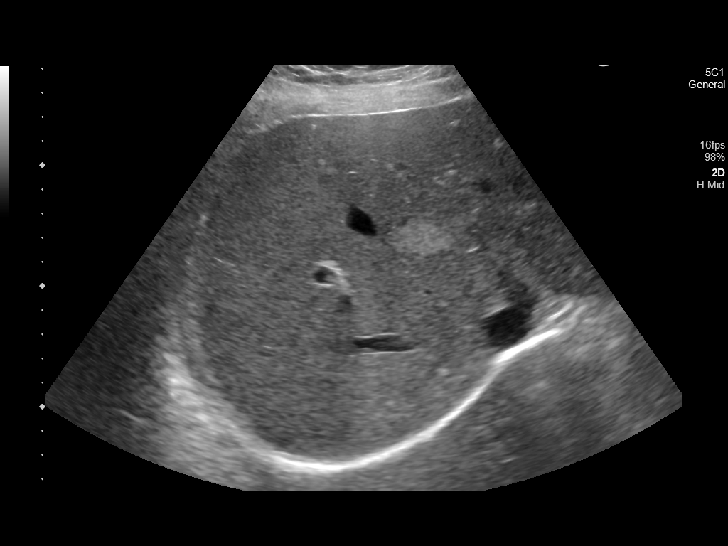
[im 45/50]
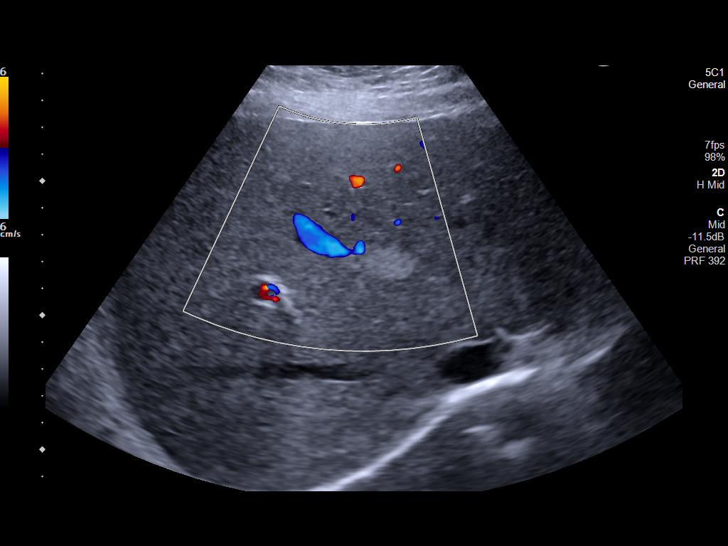
[im 50/50]
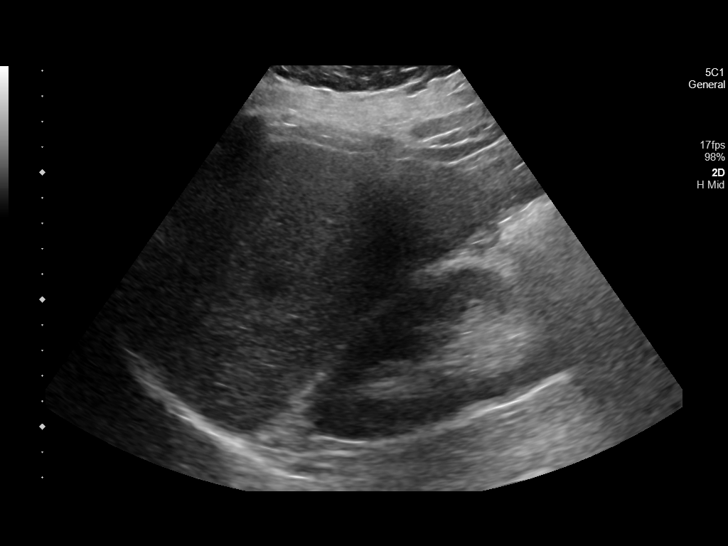

[14 of 25 positions shown; findings below may reference images not displayed]

FINDINGS: Gallbladder:

Multiple gallstones identified measuring up to 1.9 cm. Sludge noted
in the lumen is well. Gallbladder wall is thick at 4-5 mm. Patient
has received pain medication limiting assessment for sonographic
Murphy sign.

Common bile duct:

Diameter: 5 mm

Liver:

3.8 cm hyperechoic lesion identified in the right liver. No liver
lesion visible on noncontrast CT from 9998. Portal vein is patent on
color Doppler imaging with normal direction of blood flow towards
the liver.

Other: None.

Appearance of pylorus:
IMPRESSION: 1. Cholelithiasis with gallbladder wall thickening. Imaging features
suggest acute cholecystitis. Clinical picture is equivocal, nuclear
scintigraphy to assess for cystic duct obstruction may prove
helpful.
2. 3.8 cm hyperechoic lesion right hepatic lobe. Primary
differential considerations include hemangioma or potentially focal
fatty infiltration. Nonemergent follow-up outpatient MRI without and
with contrast recommended to confirm.

If

## 2022-02-26 IMAGING — DX DG CHEST 1V PORT
1 series · 1 of 1 positions shown · non-contrast
Comparison: 06/27/2007 CT

CLINICAL DATA: Chest pain

EXAM:
PORTABLE CHEST 1 VIEW

[chest ap]
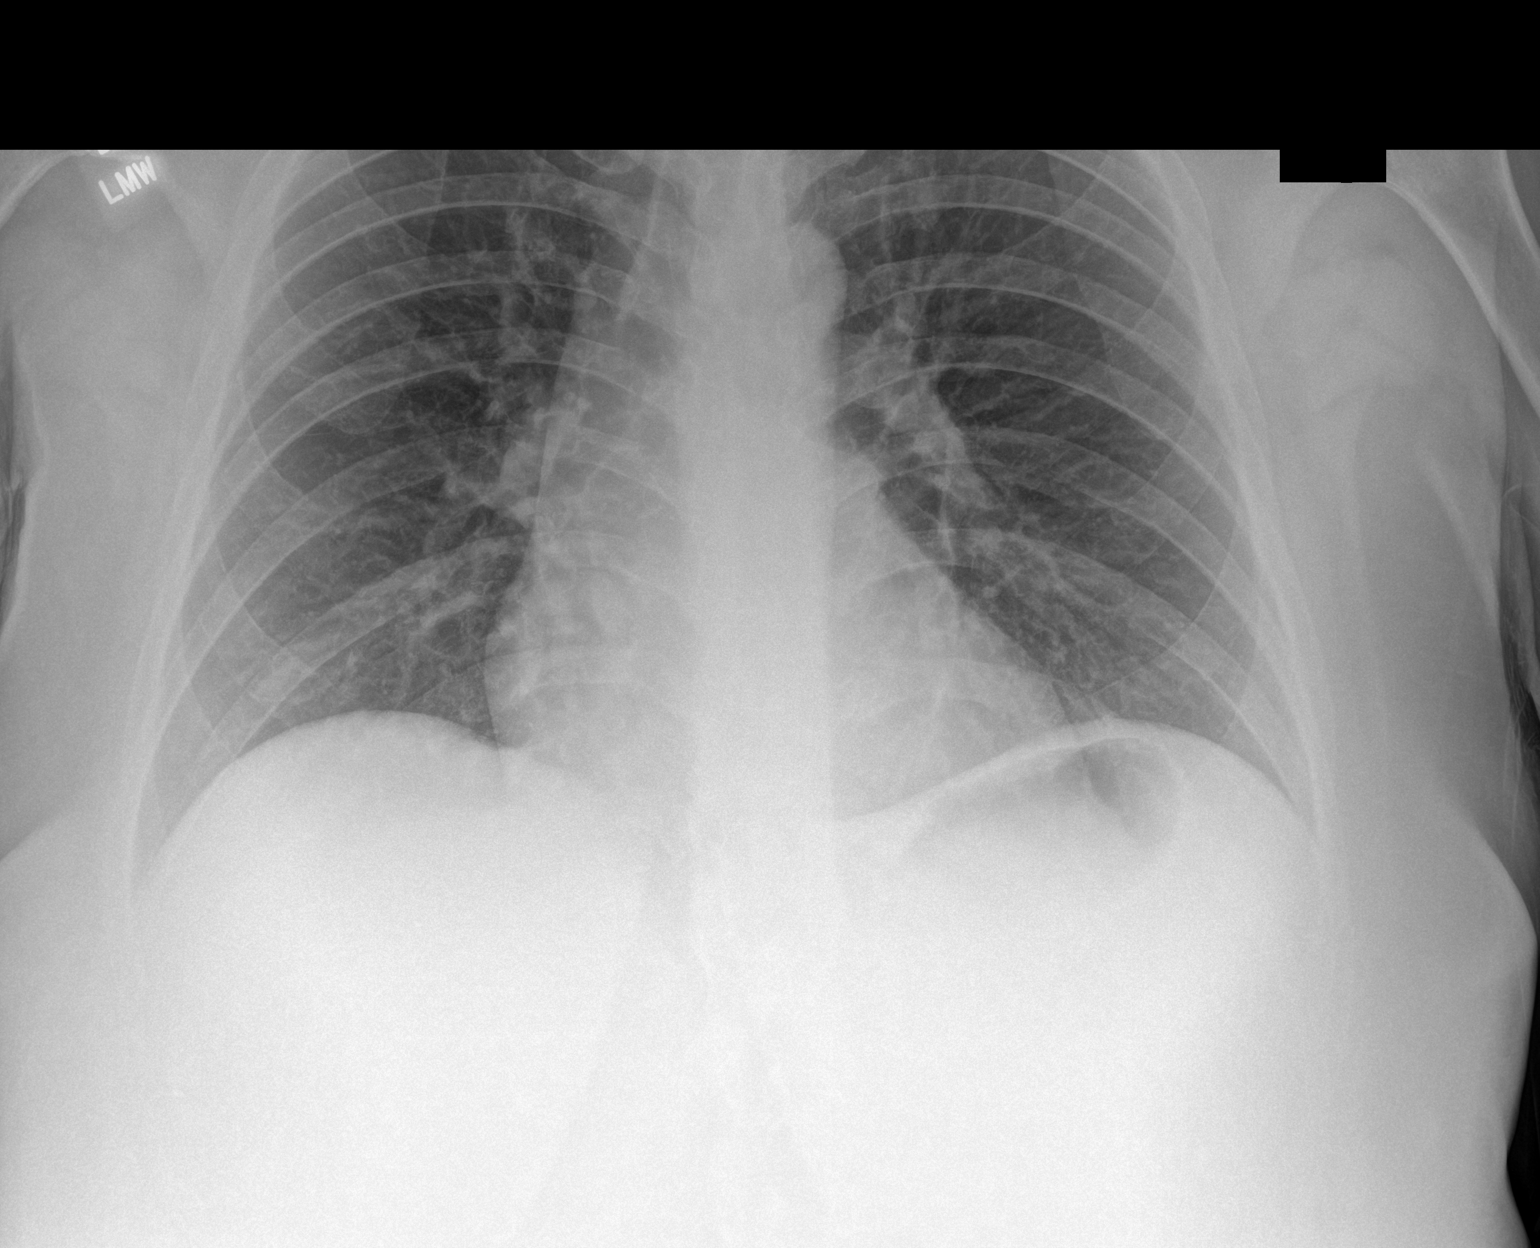

[1 of 1 positions shown; findings below may reference images not displayed]

FINDINGS: The heart size and mediastinal contours are within normal limits.
Both lungs are clear. The visualized skeletal structures are
unremarkable.
IMPRESSION: No active disease.

## 2022-05-08 ENCOUNTER — Other Ambulatory Visit: Payer: Self-pay

## 2022-05-08 ENCOUNTER — Emergency Department
Admission: EM | Admit: 2022-05-08 | Discharge: 2022-05-08 | Disposition: A | Discharge status: Home / Self Care | Payer: BC Managed Care – PPO | Attending: Emergency Medicine | Admitting: Emergency Medicine

## 2022-05-08 ENCOUNTER — Emergency Department: Payer: BC Managed Care – PPO

## 2022-05-08 ENCOUNTER — Ambulatory Visit: Payer: Self-pay

## 2022-05-08 DIAGNOSIS — K5730 Diverticulosis of large intestine without perforation or abscess without bleeding: Secondary | ICD-10-CM | POA: Diagnosis not present

## 2022-05-08 DIAGNOSIS — N132 Hydronephrosis with renal and ureteral calculous obstruction: Secondary | ICD-10-CM | POA: Insufficient documentation

## 2022-05-08 DIAGNOSIS — N201 Calculus of ureter: Secondary | ICD-10-CM

## 2022-05-08 DIAGNOSIS — R109 Unspecified abdominal pain: Secondary | ICD-10-CM | POA: Diagnosis not present

## 2022-05-08 LAB — CBC
HCT: 44.2 % (ref 36.0–46.0)
Hemoglobin: 14.5 g/dL (ref 12.0–15.0)
MCH: 29 pg (ref 26.0–34.0)
MCHC: 32.8 g/dL (ref 30.0–36.0)
MCV: 88.4 fL (ref 80.0–100.0)
Platelets: 322 10*3/uL (ref 150–400)
RBC: 5 MIL/uL (ref 3.87–5.11)
RDW: 12.3 % (ref 11.5–15.5)
WBC: 9.7 10*3/uL (ref 4.0–10.5)
nRBC: 0 % (ref 0.0–0.2)

## 2022-05-08 LAB — URINALYSIS, ROUTINE W REFLEX MICROSCOPIC
Bilirubin Urine: NEGATIVE
Glucose, UA: NEGATIVE mg/dL
Ketones, ur: NEGATIVE mg/dL
Leukocytes,Ua: NEGATIVE
Nitrite: NEGATIVE
Protein, ur: 100 mg/dL — AB
RBC / HPF: >50 RBC/hpf (ref 0–5)
Specific Gravity, Urine: 1.034 — ABNORMAL HIGH (ref 1.005–1.030)
pH: 5 (ref 5.0–8.0)

## 2022-05-08 LAB — BASIC METABOLIC PANEL
Anion gap: 10 (ref 5–15)
BUN: 12 mg/dL (ref 6–20)
CO2: 24 mmol/L (ref 22–32)
Calcium: 8.9 mg/dL (ref 8.9–10.3)
Chloride: 103 mmol/L (ref 98–111)
Creatinine, Ser: 0.65 mg/dL (ref 0.44–1.00)
GFR, Estimated: >60 mL/min (ref >60)
Glucose, Bld: 97 mg/dL (ref 70–99)
Potassium: 4 mmol/L (ref 3.5–5.1)
Sodium: 137 mmol/L (ref 135–145)

## 2022-05-08 LAB — POC URINE PREG, ED: Preg Test, Ur: NEGATIVE

## 2022-05-08 MED ORDER — ONDANSETRON HCL 4 MG PO TABS: 4.0000 mg | ORAL_TABLET | Freq: Three times a day (TID) | ORAL | 0 refills | Status: DC | PRN

## 2022-05-08 MED ORDER — MORPHINE SULFATE (PF) 4 MG/ML IV SOLN
4.0000 mg | Freq: Once | INTRAVENOUS | Status: AC
  Administered 2022-05-08: 4 mg via INTRAVENOUS
  Filled 2022-05-08: qty 1

## 2022-05-08 MED ORDER — ONDANSETRON HCL 4 MG PO TABS: 4.0000 mg | ORAL_TABLET | Freq: Three times a day (TID) | ORAL | 0 refills | Status: AC | PRN

## 2022-05-08 MED ORDER — NAPROXEN 500 MG PO TABS: 500.0000 mg | ORAL_TABLET | Freq: Two times a day (BID) | ORAL | 0 refills | Status: DC

## 2022-05-08 MED ORDER — KETOROLAC TROMETHAMINE 15 MG/ML IJ SOLN
15.0000 mg | Freq: Once | INTRAMUSCULAR | Status: AC
  Administered 2022-05-08: 15 mg via INTRAVENOUS
  Filled 2022-05-08: qty 1

## 2022-05-08 MED ORDER — SODIUM CHLORIDE 0.9 % IV BOLUS
1000.0000 mL | Freq: Once | INTRAVENOUS | Status: AC
  Administered 2022-05-08: 1000 mL via INTRAVENOUS

## 2022-05-08 MED ORDER — OXYCODONE HCL 5 MG PO TABS: 5.0000 mg | ORAL_TABLET | Freq: Three times a day (TID) | ORAL | 0 refills | Status: AC | PRN

## 2022-05-08 MED ORDER — TAMSULOSIN HCL 0.4 MG PO CAPS: 0.4000 mg | ORAL_CAPSULE | Freq: Every day | ORAL | 0 refills | Status: DC

## 2022-05-08 MED ORDER — NAPROXEN 500 MG PO TABS: 500.0000 mg | ORAL_TABLET | Freq: Two times a day (BID) | ORAL | 0 refills | Status: AC

## 2022-05-08 MED ORDER — ONDANSETRON HCL 4 MG/2ML IJ SOLN
4.0000 mg | Freq: Once | INTRAMUSCULAR | Status: AC
  Administered 2022-05-08: 4 mg via INTRAVENOUS
  Filled 2022-05-08: qty 2

## 2022-05-08 MED ORDER — TAMSULOSIN HCL 0.4 MG PO CAPS: 0.4000 mg | ORAL_CAPSULE | Freq: Every day | ORAL | 0 refills | Status: AC

## 2022-05-08 NOTE — ED Provider Notes (Signed)
[facility] Provider Note    Event Date/Time   First MD Initiated Contact with Patient 05/08/22 1105     (approximate)   History   Flank Pain   HPI  Caitlin Cochran is a 37 y.o. female with a past medical history of kidney stones who presents today for evaluation of left-sided flank pain.  Patient reports that her symptoms began yesterday.  She noticed urge to urinate, and then later developed left-sided flank pain.  She reports that this feels exactly the same as her previous kidney stones.  She has not had any vomiting, though she reports that she is nauseated.  No burning with urination.  No fevers or chills.  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      Excl. in GC?     Most recent vital signs: Vitals:   05/08/22 1051  BP: (!) 153/101  Pulse: 81  Resp: 18  Temp: 98 F (36.7 C)  SpO2: 98%    Physical Exam Vitals and nursing note reviewed.  Constitutional:      General: Awake and alert.  Mildly uncomfortable appearing    Appearance: Normal appearance. The patient is obese.  HENT:     Head: Normocephalic and atraumatic.     Mouth: Mucous membranes are moist.  Eyes:     General: PERRL. Normal EOMs        Right eye: No discharge.        Left eye: No discharge.     Conjunctiva/sclera: Conjunctivae normal.  Cardiovascular:     Rate and Rhythm: Normal rate and regular rhythm.     Pulses: Normal pulses.  Pulmonary:     Effort: Pulmonary effort is normal. No respiratory distress.     Breath sounds: Normal breath sounds.   Abdominal:     Abdomen is soft. There is no abdominal tenderness. No rebound or guarding. No distention.  No CVA tenderness Musculoskeletal:        General: No swelling. Normal range of motion.     Cervical back: Normal range of motion and neck supple.  Skin:    General: Skin is warm and dry.     Capillary Refill: Capillary refill takes less than 2 seconds.     Findings: No rash.  Neurological:     Mental Status: The patient is awake and alert.      ED Results / Procedures / Treatments   Labs (all labs ordered are listed, but only abnormal results are displayed) Labs Reviewed  URINALYSIS, ROUTINE W REFLEX MICROSCOPIC - Abnormal; Notable for the following components:      Result Value   Color, Urine AMBER (*)    APPearance CLOUDY (*)    Specific Gravity, Urine  1.034 (*)    Hgb urine dipstick LARGE (*)    Protein, ur 100 (*)    Bacteria, UA FEW (*)    All other components within normal limits  BASIC METABOLIC PANEL  CBC  POC URINE PREG, ED     EKG     RADIOLOGY I independently reviewed and interpreted imaging and agree with radiologists findings.     PROCEDURES:  Critical Care performed:   Procedures   MEDICATIONS ORDERED IN ED: Medications  sodium chloride 0.9 % bolus 1,000 mL (0 mLs Intravenous Stopped 05/08/22 1235)  ondansetron (ZOFRAN) injection 4 mg (4 mg Intravenous Given 05/08/22 1132)  ketorolac (TORADOL) 15 MG/ML injection 15 mg (15 mg Intravenous Given 05/08/22 1132)  morphine (PF) 4 MG/ML injection 4 mg (4 mg Intravenous Given 05/08/22 1218)     IMPRESSION / MDM / ASSESSMENT AND PLAN / ED COURSE  I reviewed the triage vital signs and the nursing notes.   Differential diagnosis includes, but is not limited to, nephrolithiasis, urinary tract infection, pyelonephritis, diverticulitis.  Patient is awake and alert, hemodynamically stable and afebrile.  She rated her pain to be 7 out of 10 on arrival.  Labs obtained are overall reassuring.  No  leukocytosis.  Her urinalysis reveals hemoglobin, though no leukocytes or nitrites.  It did reveal rare bacteria and 6-10 WBCs, however there is significant squamous cell contaminant.  CT renal stone was obtained which reveals a stone at her left UVJ, measuring approximately 3 mm.  She was treated symptomatically with Toradol and Zofran and normal saline with significant improvement of her symptoms.  She was subsequently given a dose of morphine, and reports that her pain has resolved.  She reports that she feels ready for discharge, she was discharged home on Zofran, naproxen, flomax, and given a small amount of oxycodone.  She was advised to only use oxycodone for breakthrough pain as it is very addictive.  She was also advised that she cannot drive, operate heavy machinery, or perform any test that require concentration while taking this medication.  She was instructed to follow-up with urology and the appropriate follow-up information was provided.  We also discussed strict return precautions for signs of an infected stone or other concerns.  Patient understands and agrees with plan.  She was discharged in stable condition.  Her mother is driving her home.   Patient's presentation is most consistent with acute complicated illness / injury requiring diagnostic workup.   Clinical Course as of 05/08/22 1316  Mon May 08, 2022  1214 Patient reports that her pain has improved to a 2 out of 10, though she is requesting 1 dose of morphine before she goes home [JP]    Clinical Course User Index [JP] Lulu Hirschmann, Herb Grays, PA-C     FINAL CLINICAL IMPRESSION(S) / ED DIAGNOSES   Final diagnoses:  Left ureteral stone     Rx / DC Orders   ED Discharge Orders          Ordered    naproxen (NAPROSYN) 500 MG tablet  2 times daily with meals,   Status:  Discontinued        05/08/22 1221    ondansetron (ZOFRAN) 4 MG tablet  Every 8 hours PRN,   Status:  Discontinued        05/08/22 1221    tamsulosin  (FLOMAX) 0.4 MG CAPS capsule  Daily,   Status:  Discontinued        05/08/22 1221    oxyCODONE (  ROXICODONE) 5 MG immediate release tablet  Every 8 hours PRN        05/08/22 1222    naproxen (NAPROSYN) 500 MG tablet  2 times daily with meals        05/08/22 1237    ondansetron (ZOFRAN) 4 MG tablet  Every 8 hours PRN        05/08/22 1237    tamsulosin (FLOMAX) 0.4 MG CAPS capsule  Daily        05/08/22 1237             Note:  This document was prepared using Dragon voice recognition software and may include unintentional dictation errors.   Keturah Shavers 05/08/22 1316    Georga Hacking, MD 05/08/22 [phone]

## 2022-05-08 NOTE — ED Triage Notes (Signed)
Pt comes with c/o left sided flank pain that started this am. Pt states she was thinking UTI and was having burning. Pt states she went to work and it got worse. Pt now thinks kidney stone and has hx of this.

## 2022-05-08 NOTE — Discharge Instructions (Addendum)
Your CAT scan shows a left 3 mm stone that is very close to her bladder.  You may take the medications as prescribed to help with your pain until you pass the stone.  Remember that the oxycodone is highly addictive and should only be used for breakthrough pain when Tylenol and ibuprofen do not work first.  Also remember that you cannot drive, operate heavy machinery, or perform any tasks that requires concentration while taking this medication.  Please follow-up with the urologist.  It was a pleasure caring for you today.
